# Patient Record
Sex: Female | Born: 1975 | Race: Black or African American | Hispanic: No | Smoking: Former smoker
Health system: Southern US, Community
[De-identification: ages and names within clinical notes are randomized; demographics above are authoritative.]

## PROBLEM LIST (undated history)

## (undated) DIAGNOSIS — Z9089 Acquired absence of other organs: Secondary | ICD-10-CM

## (undated) DIAGNOSIS — E119 Type 2 diabetes mellitus without complications: Secondary | ICD-10-CM

## (undated) DIAGNOSIS — O139 Gestational [pregnancy-induced] hypertension without significant proteinuria, unspecified trimester: Secondary | ICD-10-CM

## (undated) DIAGNOSIS — C679 Malignant neoplasm of bladder, unspecified: Secondary | ICD-10-CM

## (undated) DIAGNOSIS — E785 Hyperlipidemia, unspecified: Secondary | ICD-10-CM

## (undated) DIAGNOSIS — B999 Unspecified infectious disease: Secondary | ICD-10-CM

## (undated) DIAGNOSIS — B009 Herpesviral infection, unspecified: Secondary | ICD-10-CM

## (undated) DIAGNOSIS — R87629 Unspecified abnormal cytological findings in specimens from vagina: Secondary | ICD-10-CM

## (undated) HISTORY — DX: Hyperlipidemia, unspecified: E78.5

## (undated) HISTORY — DX: Acquired absence of other organs: Z90.89

## (undated) HISTORY — DX: Type 2 diabetes mellitus without complications: E11.9

## (undated) HISTORY — DX: Herpesviral infection, unspecified: B00.9

---
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
## 2014-03-08 ENCOUNTER — Encounter (HOSPITAL_COMMUNITY): Payer: Self-pay | Admitting: Emergency Medicine

## 2014-03-08 ENCOUNTER — Emergency Department (HOSPITAL_COMMUNITY)
Admission: EM | Admit: 2014-03-08 | Discharge: 2014-03-08 | Disposition: A | Discharge status: Home / Self Care | Payer: 59 | Attending: Emergency Medicine | Admitting: Emergency Medicine

## 2014-03-08 DIAGNOSIS — Z79899 Other long term (current) drug therapy: Secondary | ICD-10-CM | POA: Insufficient documentation

## 2014-03-08 DIAGNOSIS — A499 Bacterial infection, unspecified: Secondary | ICD-10-CM | POA: Insufficient documentation

## 2014-03-08 DIAGNOSIS — Z3202 Encounter for pregnancy test, result negative: Secondary | ICD-10-CM | POA: Insufficient documentation

## 2014-03-08 DIAGNOSIS — E669 Obesity, unspecified: Secondary | ICD-10-CM | POA: Insufficient documentation

## 2014-03-08 DIAGNOSIS — R319 Hematuria, unspecified: Secondary | ICD-10-CM

## 2014-03-08 DIAGNOSIS — B9689 Other specified bacterial agents as the cause of diseases classified elsewhere: Secondary | ICD-10-CM | POA: Insufficient documentation

## 2014-03-08 DIAGNOSIS — N760 Acute vaginitis: Secondary | ICD-10-CM | POA: Insufficient documentation

## 2014-03-08 LAB — POC URINE PREG, ED: Preg Test, Ur: NEGATIVE

## 2014-03-08 LAB — URINE MICROSCOPIC-ADD ON

## 2014-03-08 LAB — URINALYSIS, ROUTINE W REFLEX MICROSCOPIC
Bilirubin Urine: NEGATIVE
GLUCOSE, UA: NEGATIVE mg/dL
Ketones, ur: NEGATIVE mg/dL
LEUKOCYTES UA: NEGATIVE
Nitrite: NEGATIVE
PROTEIN: NEGATIVE mg/dL
Specific Gravity, Urine: 1.016 (ref 1.005–1.030)
UROBILINOGEN UA: 0.2 mg/dL (ref 0.0–1.0)
pH: 5.5 (ref 5.0–8.0)

## 2014-03-08 LAB — WET PREP, GENITAL
Trich, Wet Prep: NONE SEEN
Yeast Wet Prep HPF POC: NONE SEEN

## 2014-03-08 MED ORDER — METRONIDAZOLE 500 MG PO TABS: 500.0000 mg | ORAL_TABLET | Freq: Two times a day (BID) | ORAL | Status: DC

## 2014-03-08 NOTE — ED Provider Notes (Signed)
Medical screening examination/treatment/procedure(s) were performed by non-physician practitioner and as supervising physician I was immediately available for consultation/collaboration.   Kathalene Frames, MD 03/08/14 [phone]

## 2014-03-08 NOTE — ED Notes (Signed)
Pt states that she has been having hematuria in the morning when she wakes up x 1 month.  Denies pain.  Brown vaginal discharge.  Denies odor.

## 2014-03-08 NOTE — ED Provider Notes (Signed)
CSN: [id number]     Arrival date & time 03/08/14  1022 History   First MD Initiated Contact with Patient 03/08/14 1042     Chief Complaint  Patient presents with  . Hematuria     (Consider location/radiation/quality/duration/timing/severity/associated sxs/prior Treatment) HPI Comments: 38 y/o obese female presents to the ED complaining of hematuria x 1 month. Pt states she notices dark blood in her urine when she wakes up in the morning with associated increased frequency. Admits to brown vaginal discharge without an odor. Denies dysuria, abdominal pain, flank pain, fever or chills. LMP 1 month ago and only lasted 2 days. States she had sexual intercourse 2 days ago for the first time in 8 months.  Patient is a 38 y.o. female presenting with hematuria. The history is provided by the patient.  Hematuria    History reviewed. No pertinent past medical history. Past Surgical History  Procedure Laterality Date  . Tonsillectomy     History reviewed. No pertinent family history. History  Substance Use Topics  . Smoking status: Never Smoker   . Smokeless tobacco: Not on file  . Alcohol Use: Not on file   OB History   Grav Para Term Preterm Abortions TAB SAB Ect Mult Living                 Review of Systems  Genitourinary: Positive for hematuria and vaginal discharge.  All other systems reviewed and are negative.      Allergies  Review of patient's allergies indicates no known allergies.  Home Medications   Current Outpatient Rx  Name  Route  Sig  Dispense  Refill  . acetaminophen (TYLENOL) 160 MG/5ML suspension   Oral   Take by mouth every 6 (six) hours as needed for headache.         . cabergoline (DOSTINEX) 0.5 MG tablet   Oral   Take 0.25 mg by mouth 2 (two) times a week.         . metroNIDAZOLE (FLAGYL) 500 MG tablet   Oral   Take 1 tablet (500 mg total) by mouth 2 (two) times daily. One po bid x 7 days   14 tablet   0    BP 112/58  Pulse 60  Temp(Src)  98.6 F (37 C) (Oral)  Resp 16  SpO2 98%  LMP 01/08/2014 Physical Exam  Nursing note and vitals reviewed. Constitutional: She is oriented to person, place, and time. She appears well-developed and well-nourished. No distress.  HENT:  Head: Normocephalic and atraumatic.  Mouth/Throat: Oropharynx is clear and moist.  Eyes: Conjunctivae are normal.  Neck: Normal range of motion. Neck supple.  Cardiovascular: Normal rate, regular rhythm and normal heart sounds.   Pulmonary/Chest: Effort normal and breath sounds normal.  Abdominal: Soft. Bowel sounds are normal. She exhibits no distension. There is no tenderness.  Genitourinary: Uterus normal. Cervix exhibits no motion tenderness, no discharge and no friability. Right adnexum displays no mass, no tenderness and no fullness. Left adnexum displays no mass, no tenderness and no fullness. No erythema around the vagina. Vaginal discharge (clear brown) found.  Exam limited by pt's body habitus.  Musculoskeletal: Normal range of motion. She exhibits no edema.  Neurological: She is alert and oriented to person, place, and time.  Skin: Skin is warm and dry. She is not diaphoretic.  Psychiatric: She has a normal mood and affect. Her behavior is normal.    ED Course  Procedures (including critical care time) Labs Review Labs Reviewed  WET PREP, GENITAL - Abnormal; Notable for the following:    Clue Cells Wet Prep HPF POC FEW (*)    WBC, Wet Prep HPF POC FEW (*)    All other components within normal limits  URINALYSIS, ROUTINE W REFLEX MICROSCOPIC - Abnormal; Notable for the following:    Hgb urine dipstick TRACE (*)    All other components within normal limits  GC/CHLAMYDIA PROBE AMP  URINE MICROSCOPIC-ADD ON  POC URINE PREG, ED   Imaging Review No results found.   EKG Interpretation None      MDM   Final diagnoses:  BV (bacterial vaginosis)  Hematuria    Tx with flagl. Advised PCP f/u if hematuria continues. No associated pain.  No CMT or adnexal tenderness. Stable for d/c. States she has appt with PCP 4/22. Return precautions given. Patient states understanding of treatment care plan and is agreeable.     Illene Labrador, PA-C 03/08/14 1215

## 2014-03-08 NOTE — Discharge Instructions (Signed)
Take antibiotic flagyl twice daily for the next week. Do not have intercourse for 7 days. Follow up with your doctor.  Bacterial Vaginosis Bacterial vaginosis is a vaginal infection that occurs when the normal balance of bacteria in the vagina is disrupted. It results from an overgrowth of certain bacteria. This is the most common vaginal infection in women of childbearing age. Treatment is important to prevent complications, especially in pregnant women, as it can cause a premature delivery. CAUSES  Bacterial vaginosis is caused by an increase in harmful bacteria that are normally present in smaller amounts in the vagina. Several different kinds of bacteria can cause bacterial vaginosis. However, the reason that the condition develops is not fully understood. RISK FACTORS Certain activities or behaviors can put you at an increased risk of developing bacterial vaginosis, including:  Having a new sex partner or multiple sex partners.  Douching.  Using an intrauterine device (IUD) for contraception. Women do not get bacterial vaginosis from toilet seats, bedding, swimming pools, or contact with objects around them. SIGNS AND SYMPTOMS  Some women with bacterial vaginosis have no signs or symptoms. Common symptoms include:  Grey vaginal discharge.  A fishlike odor with discharge, especially after sexual intercourse.  Itching or burning of the vagina and vulva.  Burning or pain with urination. DIAGNOSIS  Your health care provider will take a medical history and examine the vagina for signs of bacterial vaginosis. A sample of vaginal fluid may be taken. Your health care provider will look at this sample under a microscope to check for bacteria and abnormal cells. A vaginal pH test may also be done.  TREATMENT  Bacterial vaginosis may be treated with antibiotic medicines. These may be given in the form of a pill or a vaginal cream. A second round of antibiotics may be prescribed if the condition  comes back after treatment.  HOME CARE INSTRUCTIONS   Only take over-the-counter or prescription medicines as directed by your health care provider.  If antibiotic medicine was prescribed, take it as directed. Make sure you finish it even if you start to feel better.  Do not have sex until treatment is completed.  Tell all sexual partners that you have a vaginal infection. They should see their health care provider and be treated if they have problems, such as a mild rash or itching.  Practice safe sex by using condoms and only having one sex partner. SEEK MEDICAL CARE IF:   Your symptoms are not improving after 3 days of treatment.  You have increased discharge or pain.  You have a fever. MAKE SURE YOU:   Understand these instructions.  Will watch your condition.  Will get help right away if you are not doing well or get worse. FOR MORE INFORMATION  Centers for Disease Control and Prevention, Division of STD Prevention: [employer] American Sexual Health Association (ASHA): www.ashastd.org  Document Released: 11/24/2005 Document Revised: 09/14/2013 Document Reviewed: 07/06/2013 [facility] Patient Information 2014 [place].  Hematuria, Adult Hematuria is blood in your urine. It can be caused by a bladder infection, kidney infection, prostate infection, kidney stone, or cancer of your urinary tract. Infections can usually be treated with medicine, and a kidney stone usually will pass through your urine. If neither of these is the cause of your hematuria, further workup to find out the reason may be needed. It is very important that you tell your health care provider about any blood you see in your urine, even if the blood stops without treatment  or happens without causing pain. Blood in your urine that happens and then stops and then happens again can be a symptom of a very serious condition. Also, pain is not a symptom in the initial stages of many urinary cancers. HOME  CARE INSTRUCTIONS   Drink lots of fluid, 3 4 quarts a day. If you have been diagnosed with an infection, cranberry juice is especially recommended, in addition to large amounts of water.  Avoid caffeine, tea, and carbonated beverages, because they tend to irritate the bladder.  Avoid alcohol because it may irritate the prostate.  Only take over-the-counter or prescription medicines for pain, discomfort, or fever as directed by your health care provider.  If you have been diagnosed with a kidney stone, follow your health care provider's instructions regarding straining your urine to catch the stone.  Empty your bladder often. Avoid holding urine for long periods of time.  After a bowel movement, women should cleanse front to back. Use each tissue only once.  Empty your bladder before and after sexual intercourse if you are a female. SEEK MEDICAL CARE IF: You develop back pain, fever, a feeling of sickness in your stomach (nausea), or vomiting or if your symptoms are not better in 3 days. Return sooner if you are getting worse. SEEK IMMEDIATE MEDICAL CARE IF:   You have a persistent fever, with a temperature of 101.62F (38.8C) or greater.  You develop severe vomiting and are unable to keep the medicine down.  You develop severe back or abdominal pain despite taking your medicines.  You begin passing a large amount of blood or clots in your urine.  You feel extremely weak or faint, or you pass out. MAKE SURE YOU:   Understand these instructions.  Will watch your condition.  Will get help right away if you are not doing well or get worse. Document Released: 11/24/2005 Document Revised: 09/14/2013 Document Reviewed: 07/25/2013 [facility] Patient Information 2014 [place].

## 2014-03-09 LAB — GC/CHLAMYDIA PROBE AMP
CT Probe RNA: NEGATIVE
GC Probe RNA: NEGATIVE

## 2014-03-29 ENCOUNTER — Encounter: Payer: Self-pay | Admitting: Family Medicine

## 2014-03-29 ENCOUNTER — Ambulatory Visit (INDEPENDENT_AMBULATORY_CARE_PROVIDER_SITE_OTHER): Payer: 59 | Admitting: Family Medicine

## 2014-03-29 VITALS — BP 112/70 | HR 53 | Temp 98.5°F | Resp 16 | Ht 64.5 in | Wt 221.8 lb

## 2014-03-29 DIAGNOSIS — D352 Benign neoplasm of pituitary gland: Secondary | ICD-10-CM

## 2014-03-29 DIAGNOSIS — L989 Disorder of the skin and subcutaneous tissue, unspecified: Secondary | ICD-10-CM

## 2014-03-29 DIAGNOSIS — E669 Obesity, unspecified: Secondary | ICD-10-CM | POA: Insufficient documentation

## 2014-03-29 DIAGNOSIS — D353 Benign neoplasm of craniopharyngeal duct: Secondary | ICD-10-CM

## 2014-03-29 DIAGNOSIS — L819 Disorder of pigmentation, unspecified: Secondary | ICD-10-CM

## 2014-03-29 MED ORDER — CABERGOLINE 0.5 MG PO TABS
0.2500 mg | ORAL_TABLET | ORAL | Status: DC
Start: 2014-03-29 — End: 2015-06-21

## 2014-03-29 NOTE — Patient Instructions (Signed)
You may want to try a skin care line "BLACK OPAL" or "AMBI" which make products for woman of color. Using these products daily will help even out your skin tone and correct any discoloration. The Dermatologist may then prescribe some other product to help correct the problem further.

## 2014-03-29 NOTE — Progress Notes (Signed)
S:  This 38 y.o. AA female is new to [facility]; she has hx of Pituitary tumor diagnosed ~ 8 years ago. An Endocrinologist in [place], [place] began treatment w/ [facility] which pt states is very helpful and menses are regular. She was under care of Ruffin Frederick, MD but his office has closed. She needs referral to Endocrine Specialist for continuation of management of this condition.  Pt has skin condition related to endocrine disturbance; she has excessive hair, especially on her face. She has been shaving facial hair and now has discoloration around jaw line x 2 years. Skincare regimen includes Noxema and some other product purchased at local pharmacy. She has tried many different types of fade creams.  Pt reports CPE/GYN exam in January or February 2015.   Patient Active Problem List   Diagnosis Date Noted  . 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 Obesity, unspecified 03/29/2014    Prior to Admission medications   Medication Sig Start Date End Date Taking? Authorizing Provider  acetaminophen (TYLENOL) 160 MG/5ML suspension Take by mouth every 6 (six) hours as needed for headache.   Yes Historical Provider, MD  cabergoline (DOSTINEX) 0.5 MG tablet Take 0.5 tablets (0.25 mg total) by mouth 2 (two) times a week.   Yes    PMHx, Surg Hx, Soc and Fam Hx reviewed.  ROS: As per HPI; otherwise noncontributory.  O: Filed Vitals:   03/29/14 1206  BP: 112/70  Pulse: 53  Temp: 98.5 F (36.9 C)  Resp: 16   GEN: In NAD; WN,WD. HENT: [place]/AT; EOMI w/ clear conj/sclerae. EACs/nose/oroph unremarkable. SKIN: Facial area- jaw line- papular hyperpigmentation; no vesicles or pustules. Similar lesions on upper chest. COR: RRR; no edema. LUNGS: Unlabored resp. MS: MAEs; no deformities, c/c/e. NEURO: A&O x 3; CNs intact. Nonfocal.  A/P: Prolactinoma - Continue current medication pending follow-up w/ specialist. Plan: Ambulatory referral to Endocrinology  Facial skin lesion - Advised new skin care regimen.  Plan:  Ambulatory referral to Dermatology  Pigmented skin lesions - Plan: Ambulatory referral to Dermatology   Meds ordered this encounter  Medications  . cabergoline (DOSTINEX) 0.5 MG tablet    Sig: Take 0.5 tablets (0.25 mg total) by mouth 2 (two) times a week.    Dispense:  10 tablet    Refill:  3

## 2014-03-31 ENCOUNTER — Telehealth: Payer: Self-pay | Admitting: *Deleted

## 2014-03-31 NOTE — Telephone Encounter (Signed)
Verdis Frederickson from [facility] D[facility] calling asking for prior authorization number on this pt. Appt 4/27 at 2pm. Transferred the call to referrals.

## 2014-04-13 ENCOUNTER — Ambulatory Visit: Payer: 59 | Admitting: Internal Medicine

## 2014-05-04 ENCOUNTER — Ambulatory Visit: Payer: 59 | Admitting: Internal Medicine

## 2014-05-04 DIAGNOSIS — Z0289 Encounter for other administrative examinations: Secondary | ICD-10-CM

## 2015-01-01 ENCOUNTER — Encounter (HOSPITAL_COMMUNITY): Payer: Self-pay | Admitting: Emergency Medicine

## 2015-01-01 ENCOUNTER — Emergency Department (HOSPITAL_COMMUNITY)
Admission: EM | Admit: 2015-01-01 | Discharge: 2015-01-02 | Disposition: A | Discharge status: Home / Self Care | Payer: BLUE CROSS/BLUE SHIELD | Attending: Emergency Medicine | Admitting: Emergency Medicine

## 2015-01-01 DIAGNOSIS — Z3202 Encounter for pregnancy test, result negative: Secondary | ICD-10-CM | POA: Diagnosis not present

## 2015-01-01 DIAGNOSIS — D72829 Elevated white blood cell count, unspecified: Secondary | ICD-10-CM | POA: Insufficient documentation

## 2015-01-01 DIAGNOSIS — R1013 Epigastric pain: Secondary | ICD-10-CM

## 2015-01-01 DIAGNOSIS — R112 Nausea with vomiting, unspecified: Secondary | ICD-10-CM | POA: Diagnosis present

## 2015-01-01 DIAGNOSIS — K219 Gastro-esophageal reflux disease without esophagitis: Secondary | ICD-10-CM | POA: Diagnosis not present

## 2015-01-01 DIAGNOSIS — R197 Diarrhea, unspecified: Secondary | ICD-10-CM

## 2015-01-01 DIAGNOSIS — K529 Noninfective gastroenteritis and colitis, unspecified: Secondary | ICD-10-CM | POA: Diagnosis not present

## 2015-01-01 MED ORDER — SODIUM CHLORIDE 0.9 % IV BOLUS (SEPSIS)
1000.0000 mL | Freq: Once | INTRAVENOUS | Status: AC
  Administered 2015-01-01: 1000 mL via INTRAVENOUS

## 2015-01-01 MED ORDER — ONDANSETRON HCL 4 MG/2ML IJ SOLN
4.0000 mg | Freq: Once | INTRAMUSCULAR | Status: AC
  Administered 2015-01-01: 4 mg via INTRAVENOUS
  Filled 2015-01-01: qty 2

## 2015-01-01 MED ORDER — GI COCKTAIL ~~LOC~~
30.0000 mL | Freq: Once | ORAL | Status: AC
  Administered 2015-01-01: 30 mL via ORAL
  Filled 2015-01-01: qty 30

## 2015-01-01 MED ORDER — PANTOPRAZOLE SODIUM 40 MG IV SOLR
40.0000 mg | Freq: Once | INTRAVENOUS | Status: AC
  Administered 2015-01-01: 40 mg via INTRAVENOUS
  Filled 2015-01-01: qty 40

## 2015-01-01 NOTE — ED Provider Notes (Signed)
CSN: [id number]     Arrival date & time 01/01/15  2230 History   First MD Initiated Contact with Patient 01/01/15 2250     Chief Complaint  Patient presents with  . Abdominal Pain  . Nausea  . Diarrhea     (Consider location/radiation/quality/duration/timing/severity/associated sxs/prior Treatment) HPI Comments: Paula Bates is a 39 y.o. female with a PMHx of prolactinoma (on cabergoline), who presents to the ED with complaints of right upper quadrant/epigastric abdominal pain that began at 3 AM a gradual onset, which is currently resolved after having taken 3 Tylenol tablets. She describes the pain as 10/10 at onset but currently resolved, crampy, intermittent, nonradiating from the epigastrum/RUQ, with no known aggravating factors, and relieved entirely with tylenol. Associated symptoms include belching, more than 10 episodes of nonbloody nonbilious emesis consisting of stomach contents, and greater than 10 episodes of watery nonbloody diarrhea. Denies fevers, chills, sore throat, CP, SOB, constipation, obstipation, hematemesis, melena, hematochezia, rectal pain, hematuria, dysuria, vaginal bleeding/discharge, myalgias, arthralgias, back/flank pain, weakness, paresthesias, lightheadedness, numbness, syncope, or rashes. LMP "first week in December" but she states she doesn't keep up with it and isn't really sure when it was. Ate 2 slices of pizza today which caused her to vomit. Last night she ate a variety of foods, had friends over for the game. Unknown sick contacts. No suspicious foods or travel, no EtOH/NSAID use, no prior abd surgeries or abdominal conditions. No recent antibiotics  Patient is a 39 y.o. female presenting with abdominal pain and diarrhea. The history is provided by the patient. No language interpreter was used.  Abdominal Pain Pain location:  RUQ and epigastric Pain quality: cramping   Pain radiates to:  Does not radiate Pain severity:  Severe (10/10 at onset, now  resolved) Onset quality:  Gradual Duration:  20 hours Timing:  Constant Progression:  Resolved Chronicity:  New Context: not alcohol use, not recent travel, not sick contacts and not suspicious food intake   Relieved by:  Acetaminophen Worsened by:  Nothing tried Ineffective treatments:  None tried Associated symptoms: belching, diarrhea, nausea and vomiting   Associated symptoms: no anorexia, no chest pain, no chills, no constipation, no cough, no dysuria, no fever, no flatus, no hematemesis, no hematochezia, no hematuria, no melena, no shortness of breath, no sore throat, no vaginal bleeding and no vaginal discharge   Risk factors: no NSAID use   Diarrhea Associated symptoms: abdominal pain and vomiting   Associated symptoms: no arthralgias, no chills, no fever and no myalgias     History reviewed. No pertinent past medical history. Past Surgical History  Procedure Laterality Date  . Tonsillectomy  1996   Family History  Problem Relation Age of Onset  . Diabetes Mother    History  Substance Use Topics  . Smoking status: Never Smoker   . Smokeless tobacco: Not on file  . Alcohol Use: Not on file   OB History    No data available     Review of Systems  Constitutional: Negative for fever and chills.  HENT: Negative for sore throat.   Respiratory: Negative for cough and shortness of breath.   Cardiovascular: Negative for chest pain.  Gastrointestinal: Positive for nausea, vomiting, abdominal pain and diarrhea. Negative for constipation, blood in stool, melena, hematochezia, abdominal distention, rectal pain, anorexia, flatus and hematemesis.  Genitourinary: Negative for dysuria, hematuria, flank pain, vaginal bleeding, vaginal discharge and menstrual problem.  Musculoskeletal: Negative for myalgias, back pain and arthralgias.  Skin: Negative for rash.  Allergic/Immunologic:  Negative for immunocompromised state.  Neurological: Negative for dizziness, syncope, weakness,  light-headedness and numbness.  Psychiatric/Behavioral: Negative for confusion.   10 Systems reviewed and are negative for acute change except as noted in the HPI.    Allergies  Review of patient's allergies indicates no known allergies.  Home Medications   Prior to Admission medications   Medication Sig Start Date End Date Taking? Authorizing Provider  diphenhydramine-acetaminophen (TYLENOL PM) 25-500 MG TABS Take 2 tablets by mouth at bedtime as needed (pain).   Yes Historical Provider, MD  cabergoline (DOSTINEX) 0.5 MG tablet Take 0.5 tablets (0.25 mg total) by mouth 2 (two) times a week. 03/29/14   Barton Fanny, MD   BP 103/43 mmHg  Pulse 61  Temp(Src) 98.1 F (36.7 C) (Oral)  Resp 16  SpO2 97%  LMP 11/20/2014 Physical Exam  Constitutional: She is oriented to person, place, and time. Vital signs are normal. She appears well-developed and well-nourished.  Non-toxic appearance. No distress.  Afebrile, nontoxic, NAD, VSS  HENT:  Head: Normocephalic and atraumatic.  Mouth/Throat: Oropharynx is clear and moist. Mucous membranes are dry.  Dry mucous membranes  Eyes: Conjunctivae and EOM are normal. Right eye exhibits no discharge. Left eye exhibits no discharge.  Neck: Normal range of motion. Neck supple.  Cardiovascular: Normal rate, regular rhythm, normal heart sounds and intact distal pulses.  Exam reveals no gallop and no friction rub.   No murmur heard. Pulmonary/Chest: Effort normal and breath sounds normal. No respiratory distress. She has no decreased breath sounds. She has no wheezes. She has no rhonchi. She has no rales.  Abdominal: Soft. Normal appearance and bowel sounds are normal. She exhibits no distension. There is tenderness in the right upper quadrant and epigastric area. There is no rigidity, no rebound, no guarding, no CVA tenderness, no tenderness at McBurney's point and negative Murphy's sign.    Soft, obese but ND, +BS throughout, with TTP in  epigastrum and RUQ region, no r/g/r, neg murphy's, neg mcburney's, no CVA TTP   Musculoskeletal: Normal range of motion.  Neurological: She is alert and oriented to person, place, and time. She has normal strength. No sensory deficit. Gait normal.  Skin: Skin is warm, dry and intact. No rash noted.  Psychiatric: She has a normal mood and affect.  Nursing note and vitals reviewed.   ED Course  Procedures (including critical care time) Labs Review Labs Reviewed  CBC WITH DIFFERENTIAL/PLATELET - Abnormal; Notable for the following:    WBC 11.1 (*)    RBC 5.43 (*)    Neutrophils Relative % 88 (*)    Neutro Abs 9.8 (*)    Lymphocytes Relative 9 (*)    All other components within normal limits  COMPREHENSIVE METABOLIC PANEL - Abnormal; Notable for the following:    Glucose, Bld 140 (*)    All other components within normal limits  LIPASE, BLOOD  URINALYSIS, ROUTINE W REFLEX MICROSCOPIC  POC URINE PREG, ED    Imaging Review No results found.   EKG Interpretation None      MDM   Final diagnoses:  Epigastric pain  Nausea vomiting and diarrhea  Gastroenteritis  Gastroesophageal reflux disease, esophagitis presence not specified    39 y.o. female with RUQ abd pain which is currently resolved, and n/v/d. Abd exam nonperitoneal without murphy's sign, pain is mostly epigastric and RUQ. Will obtain labs and give fluids and antiemetics, protonix, and GI cocktail. Pain currently resolved therefore will hold on pain meds. Likely viral  gastroenteritis although differential includes biliary colic, cholecystitis, pyelonephritis, etc. No CVA tenderness, no pelvic pain or vaginal complaints, doubt need for pelvic exam. Doubt need for imaging but will reassess after labs.  1:38 AM CBC w/diff showing mild leukocytosis which could be from viral gastroenteritis or could be hemoconcentration (no prior labs to compare to, but pt clinically dehydrated). CMP WNL. Lipase WNL. Pt has not had BM here,  and her urine specimen was contaminated therefore she needed to collect another one and hasn't yet. Pain returning somewhat, will give small amount of morphine, pt resting comfortably when assessed therefore difficult to determine how severe pain is objectively. VSS at this time, with BP improved after IVFs. Nausea resolved, and pt has been tolerating PO well. Will await urine specimen.   2:06 AM Pt urinated again but contaminated the specimen once more. Pushing fluids orally, and in and out cath will be performed in 15-20mins. Nursing stating that pt was resting when she was assessed and therefore morphine was held.  2:24 AM Pt refusing I&O cath. Will attempt to collect another urine sample. States she took home preg test recently which was neg, but discussed with her that we need to verify this. If she cannot get an uncontaminated specimen and refuses I&O cath, she would be discharged AMA. She will attempt to get urine sample now.  3:17 AM Upreg neg. U/A still pending. Tolerating PO well and pain under control. Will sign over to [employer] PA-C to check on U/A, as long as it's negative pt will be discharged home with prilosec, zofran, naprosyn, and norco. Will have her see PCP in 1wk. I explained the diagnosis and have given explicit precautions to return to the ER including for any other new or worsening symptoms. The patient understands and accepts the medical plan as it's been dictated and I have answered their questions. Discharge instructions concerning home care and prescriptions have been given. The patient is STABLE and is discharged to home in good condition unless U/A is abnormal in which case please refer to Antonietta Breach' dictation.   BP 125/57 mmHg  Pulse 53  Temp(Src) 98.1 F (36.7 C) (Oral)  Resp 18  SpO2 99%  LMP 11/20/2014  Meds ordered this encounter  Medications  . sodium chloride 0.9 % bolus 1,000 mL    Sig:   . ondansetron (ZOFRAN) injection 4 mg    Sig:   . gi cocktail  (Maalox,Lidocaine,Donnatal)    Sig:   . pantoprazole (PROTONIX) injection 40 mg    Sig:   . morphine 4 MG/ML injection 4 mg    Sig:   . ondansetron (ZOFRAN ODT) 8 MG disintegrating tablet    Sig: Take 1 tablet (8 mg total) by mouth every 8 (eight) hours as needed for nausea or vomiting.    Dispense:  10 tablet    Refill:  0  . omeprazole (PRILOSEC) 20 MG capsule    Sig: Take 1 capsule (20 mg total) by mouth daily.    Dispense:  30 capsule    Refill:  0  . naproxen (NAPROSYN) 500 MG tablet    Sig: Take 1 tablet (500 mg total) by mouth 2 (two) times daily as needed for mild pain, moderate pain or headache (TAKE WITH MEALS.).    Dispense:  20 tablet    Refill:  0  . HYDROcodone-acetaminophen (NORCO) 5-325 MG per tablet    Sig: Take 1 tablet by mouth every 6 (six) hours as needed for severe pain.  Dispense:  6 tablet    Refill:  [address] [place]-Soms, PA-C 01/02/15 [id number]  Varney Biles, MD 01/03/15 2320

## 2015-01-01 NOTE — ED Notes (Signed)
Bed: FH54 Expected date: 01/01/15 Expected time: 10:13 PM Means of arrival: Ambulance Comments: 38 yo F  Emesis, diarrhea

## 2015-01-01 NOTE — ED Notes (Signed)
Patient presents from home via EMS for N/V/D. Patient c/o RUQ x1 day. Patient reports taking two Tylenol PM  At approximately 2100 this evening. A&O x4.

## 2015-01-01 NOTE — ED Notes (Signed)
Patient c/o RUQ abdominal pain, described as sharp in nature. Patient states "took 2 tylenol PM at 9pm and they have begun to kick in". Patient denies pain at this time. Patient reports diarrhea x1 day, "too many episodes to count". Patient ambulatory in hallway without assistance, denies dizziness and lightheadedness.

## 2015-01-02 LAB — COMPREHENSIVE METABOLIC PANEL
ALT: 18 U/L (ref 0–35)
ANION GAP: 9 (ref 5–15)
AST: 16 U/L (ref 0–37)
Albumin: 4.5 g/dL (ref 3.5–5.2)
Alkaline Phosphatase: 99 U/L (ref 39–117)
BILIRUBIN TOTAL: 0.5 mg/dL (ref 0.3–1.2)
BUN: 13 mg/dL (ref 6–23)
CALCIUM: 9.4 mg/dL (ref 8.4–10.5)
CO2: 24 mmol/L (ref 19–32)
Chloride: 106 mmol/L (ref 96–112)
Creatinine, Ser: 0.78 mg/dL (ref 0.50–1.10)
Glucose, Bld: 140 mg/dL — ABNORMAL HIGH (ref 70–99)
Potassium: 4.2 mmol/L (ref 3.5–5.1)
SODIUM: 139 mmol/L (ref 135–145)
Total Protein: 8.2 g/dL (ref 6.0–8.3)

## 2015-01-02 LAB — POC URINE PREG, ED: Preg Test, Ur: NEGATIVE

## 2015-01-02 LAB — CBC WITH DIFFERENTIAL/PLATELET
Basophils Absolute: 0 10*3/uL (ref 0.0–0.1)
Basophils Relative: 0 % (ref 0–1)
EOS ABS: 0 10*3/uL (ref 0.0–0.7)
Eosinophils Relative: 0 % (ref 0–5)
HCT: 42.5 % (ref 36.0–46.0)
Hemoglobin: 14.4 g/dL (ref 12.0–15.0)
LYMPHS PCT: 9 % — AB (ref 12–46)
Lymphs Abs: 1 10*3/uL (ref 0.7–4.0)
MCH: 26.5 pg (ref 26.0–34.0)
MCHC: 33.9 g/dL (ref 30.0–36.0)
MCV: 78.3 fL (ref 78.0–100.0)
Monocytes Absolute: 0.3 10*3/uL (ref 0.1–1.0)
Monocytes Relative: 3 % (ref 3–12)
Neutro Abs: 9.8 10*3/uL — ABNORMAL HIGH (ref 1.7–7.7)
Neutrophils Relative %: 88 % — ABNORMAL HIGH (ref 43–77)
Platelets: 266 10*3/uL (ref 150–400)
RBC: 5.43 MIL/uL — AB (ref 3.87–5.11)
RDW: 13.8 % (ref 11.5–15.5)
WBC: 11.1 10*3/uL — AB (ref 4.0–10.5)

## 2015-01-02 LAB — URINALYSIS, ROUTINE W REFLEX MICROSCOPIC
BILIRUBIN URINE: NEGATIVE
GLUCOSE, UA: NEGATIVE mg/dL
KETONES UR: NEGATIVE mg/dL
Leukocytes, UA: NEGATIVE
NITRITE: NEGATIVE
Protein, ur: NEGATIVE mg/dL
Specific Gravity, Urine: 1.02 (ref 1.005–1.030)
UROBILINOGEN UA: 0.2 mg/dL (ref 0.0–1.0)
pH: 5 (ref 5.0–8.0)

## 2015-01-02 LAB — URINE MICROSCOPIC-ADD ON

## 2015-01-02 LAB — LIPASE, BLOOD: Lipase: 34 U/L (ref 11–59)

## 2015-01-02 MED ORDER — MORPHINE SULFATE 4 MG/ML IJ SOLN
4.0000 mg | Freq: Once | INTRAMUSCULAR | Status: AC
  Administered 2015-01-02: 4 mg via INTRAVENOUS
  Filled 2015-01-02: qty 1

## 2015-01-02 MED ORDER — ONDANSETRON 8 MG PO TBDP: 8.0000 mg | ORAL_TABLET | Freq: Three times a day (TID) | ORAL | Status: DC | PRN

## 2015-01-02 MED ORDER — OMEPRAZOLE 20 MG PO CPDR: 20.0000 mg | DELAYED_RELEASE_CAPSULE | Freq: Every day | ORAL | Status: DC

## 2015-01-02 MED ORDER — NAPROXEN 500 MG PO TABS: 500.0000 mg | ORAL_TABLET | Freq: Two times a day (BID) | ORAL | Status: DC | PRN

## 2015-01-02 MED ORDER — HYDROCODONE-ACETAMINOPHEN 5-325 MG PO TABS: 1.0000 | ORAL_TABLET | Freq: Four times a day (QID) | ORAL | Status: DC | PRN

## 2015-01-02 NOTE — ED Provider Notes (Signed)
0415 - Patient care assumed from [facility], PA-C at shift change. Plan discussed with Camprubi-Soms, PA-C which includes discharge if urinalysis unremarkable. Urinalysis reviewed which is significant only for microscopic hematuria with RBCs of 11-20. Doubt that this is responsible for patient's symptoms of N/V/D. No evidence of acute blood loss. No AKI. Patient hemodynamically stable and appropriate for discharge. Urine has been sent for culture. Return precautions provided. Patient discharged in good condition.  Filed Vitals:   01/01/15 2230 01/02/15 0149 01/02/15 0410  BP: 103/43 125/57 111/47  Pulse: 61 53 64  Temp: 98.1 F (36.7 C)    TempSrc: Oral    Resp: 16 18 16   SpO2: 97% 99% 98%   Results for orders placed or performed during the hospital encounter of 01/01/15  CBC with Differential  Result Value Ref Range   WBC 11.1 (H) 4.0 - 10.5 K/uL   RBC 5.43 (H) 3.87 - 5.11 MIL/uL   Hemoglobin 14.4 12.0 - 15.0 g/dL   HCT 42.5 36.0 - 46.0 %   MCV 78.3 78.0 - 100.0 fL   MCH 26.5 26.0 - 34.0 pg   MCHC 33.9 30.0 - 36.0 g/dL   RDW 13.8 11.5 - 15.5 %   Platelets 266 150 - 400 K/uL   Neutrophils Relative % 88 (H) 43 - 77 %   Neutro Abs 9.8 (H) 1.7 - 7.7 K/uL   Lymphocytes Relative 9 (L) 12 - 46 %   Lymphs Abs 1.0 0.7 - 4.0 K/uL   Monocytes Relative 3 3 - 12 %   Monocytes Absolute 0.3 0.1 - 1.0 K/uL   Eosinophils Relative 0 0 - 5 %   Eosinophils Absolute 0.0 0.0 - 0.7 K/uL   Basophils Relative 0 0 - 1 %   Basophils Absolute 0.0 0.0 - 0.1 K/uL  Comprehensive metabolic panel  Result Value Ref Range   Sodium 139 135 - 145 mmol/L   Potassium 4.2 3.5 - 5.1 mmol/L   Chloride 106 96 - 112 mmol/L   CO2 24 19 - 32 mmol/L   Glucose, Bld 140 (H) 70 - 99 mg/dL   BUN 13 6 - 23 mg/dL   Creatinine, Ser 0.78 0.50 - 1.10 mg/dL   Calcium 9.4 8.4 - 10.5 mg/dL   Total Protein 8.2 6.0 - 8.3 g/dL   Albumin 4.5 3.5 - 5.2 g/dL   AST 16 0 - 37 U/L   ALT 18 0 - 35 U/L   Alkaline Phosphatase  99 39 - 117 U/L   Total Bilirubin 0.5 0.3 - 1.2 mg/dL   GFR calc non Af Amer >90 >90 mL/min   GFR calc Af Amer >90 >90 mL/min   Anion gap 9 5 - 15  Lipase, blood  Result Value Ref Range   Lipase 34 11 - 59 U/L  Urinalysis, Routine w reflex microscopic  Result Value Ref Range   Color, Urine YELLOW YELLOW   APPearance CLOUDY (A) CLEAR   Specific Gravity, Urine 1.020 1.005 - 1.030   pH 5.0 5.0 - 8.0   Glucose, UA NEGATIVE NEGATIVE mg/dL   Hgb urine dipstick MODERATE (A) NEGATIVE   Bilirubin Urine NEGATIVE NEGATIVE   Ketones, ur NEGATIVE NEGATIVE mg/dL   Protein, ur NEGATIVE NEGATIVE mg/dL   Urobilinogen, UA 0.2 0.0 - 1.0 mg/dL   Nitrite NEGATIVE NEGATIVE   Leukocytes, UA NEGATIVE NEGATIVE  Urine microscopic-add on  Result Value Ref Range   Squamous Epithelial / LPF FEW (A) RARE   RBC / HPF 11-20 <3  RBC/hpf  POC urine preg, ED (not at [facility])  Result Value Ref Range   Preg Test, Ur NEGATIVE NEGATIVE    Antonietta Breach, PA-C 01/02/15 [id number]

## 2015-01-02 NOTE — Discharge Instructions (Signed)
Use zofran as prescribed, as needed for nausea. Stay well hydrated with small sips of fluids throughout the day. Use naprosyn and norco as directed as needed for pain but don't drive while taking norco. Use prilosec as directed until you see your regular doctor. Make an appointment with your primary doctor in 1 week for recheck. Follow a BRAT (banana-rice-applesauce-toast) diet as described below for the next 24-48 hours. The 'BRAT' diet is suggested, then progress to diet as tolerated as symptoms abate. Call if bloody stools, persistent diarrhea, vomiting, fever or abdominal pain. Return to ER for changing or worsening of symptoms.  Food Choices to Help Relieve Diarrhea When you have diarrhea, the foods you eat and your eating habits are very important. Choosing the right foods and drinks can help relieve diarrhea. Also, because diarrhea can last up to 7 days, you need to replace lost fluids and electrolytes (such as sodium, potassium, and chloride) in order to help prevent dehydration.  WHAT GENERAL GUIDELINES DO I NEED TO FOLLOW?  Slowly drink 1 cup (8 oz) of fluid for each episode of diarrhea. If you are getting enough fluid, your urine will be clear or pale yellow.  Eat starchy foods. Some good choices include white rice, white toast, pasta, low-fiber cereal, baked potatoes (without the skin), saltine crackers, and bagels.  Avoid large servings of any cooked vegetables.  Limit fruit to two servings per day. A serving is  cup or 1 small piece.  Choose foods with less than 2 g of fiber per serving.  Limit fats to less than 8 tsp (38 g) per day.  Avoid fried foods.  Eat foods that have probiotics in them. Probiotics can be found in certain dairy products.  Avoid foods and beverages that may increase the speed at which food moves through the stomach and intestines (gastrointestinal tract). Things to avoid include:  High-fiber foods, such as dried fruit, raw fruits and vegetables, nuts,  seeds, and whole grain foods.  Spicy foods and high-fat foods.  Foods and beverages sweetened with high-fructose corn syrup, honey, or sugar alcohols such as xylitol, sorbitol, and mannitol. WHAT FOODS ARE RECOMMENDED? Grains White rice. White, [place], or pita breads (fresh or toasted), including plain rolls, buns, or bagels. White pasta. Saltine, soda, or graham crackers. Pretzels. Low-fiber cereal. Cooked cereals made with water (such as cornmeal, farina, or cream cereals). Plain muffins. Matzo. Melba toast. Zwieback.  Vegetables Potatoes (without the skin). Strained tomato and vegetable juices. Most well-cooked and canned vegetables without seeds. Tender lettuce. Fruits Cooked or canned applesauce, apricots, cherries, fruit cocktail, grapefruit, peaches, pears, or plums. Fresh bananas, apples without skin, cherries, grapes, cantaloupe, grapefruit, peaches, oranges, or plums.  Meat and Other Protein Products Baked or boiled chicken. Eggs. Tofu. Fish. Seafood. Smooth peanut butter. Ground or well-cooked tender beef, ham, veal, lamb, pork, or poultry.  Dairy Plain yogurt, kefir, and unsweetened liquid yogurt. Lactose-free milk, buttermilk, or soy milk. Plain hard cheese. Beverages Sport drinks. Clear broths. Diluted fruit juices (except prune). Regular, caffeine-free sodas such as ginger ale. Water. Decaffeinated teas. Oral rehydration solutions. Sugar-free beverages not sweetened with sugar alcohols. Other Bouillon, broth, or soups made from recommended foods.  The items listed above may not be a complete list of recommended foods or beverages. Contact your dietitian for more options. WHAT FOODS ARE NOT RECOMMENDED? Grains Whole grain, whole wheat, bran, or rye breads, rolls, pastas, crackers, and cereals. Wild or brown rice. Cereals that contain more than 2 g of fiber per serving.  Corn tortillas or taco shells. Cooked or dry oatmeal. Granola. Popcorn. Vegetables Raw vegetables. Cabbage,  broccoli, Brussels sprouts, artichokes, baked beans, beet greens, corn, kale, legumes, peas, sweet potatoes, and yams. Potato skins. Cooked spinach and cabbage. Fruits Dried fruit, including raisins and dates. Raw fruits. Stewed or dried prunes. Fresh apples with skin, apricots, mangoes, pears, raspberries, and strawberries.  Meat and Other Protein Products Chunky peanut butter. Nuts and seeds. Beans and lentils. Berniece Salines.  Dairy High-fat cheeses. Milk, chocolate milk, and beverages made with milk, such as milk shakes. Cream. Ice cream. Sweets and Desserts Sweet rolls, doughnuts, and sweet breads. Pancakes and waffles. Fats and Oils Butter. Cream sauces. Margarine. Salad oils. Plain salad dressings. Olives. Avocados.  Beverages Caffeinated beverages (such as coffee, tea, soda, or energy drinks). Alcoholic beverages. Fruit juices with pulp. Prune juice. Soft drinks sweetened with high-fructose corn syrup or sugar alcohols. Other Coconut. Hot sauce. Chili powder. Mayonnaise. Gravy. Cream-based or milk-based soups.  The items listed above may not be a complete list of foods and beverages to avoid. Contact your dietitian for more information. WHAT SHOULD I DO IF I BECOME DEHYDRATED? Diarrhea can sometimes lead to dehydration. Signs of dehydration include dark urine and dry mouth and skin. If you think you are dehydrated, you should rehydrate with an oral rehydration solution. These solutions can be purchased at pharmacies, retail stores, or online.  Drink -1 cup (120-240 mL) of oral rehydration solution each time you have an episode of diarrhea. If drinking this amount makes your diarrhea worse, try drinking smaller amounts more often. For example, drink 1-3 tsp (5-15 mL) every 5-10 minutes.  A general rule for staying hydrated is to drink 1-2 L of fluid per day. Talk to your health care provider about the specific amount you should be drinking each day. Drink enough fluids to keep your urine clear or  pale yellow. Document Released: 02/14/2004 Document Revised: 11/29/2013 Document Reviewed: 10/17/2013 [facility] Patient Information 2015 [place], [place]. This information is not intended to replace advice given to you by your health care provider. Make sure you discuss any questions you have with your health care provider.   Abdominal Pain Many things can cause belly (abdominal) pain. Most times, the belly pain is not dangerous. Many cases of belly pain can be watched and treated at home. HOME CARE   Do not take medicines that help you go poop (laxatives) unless told to by your doctor.  Only take medicine as told by your doctor.  Eat or drink as told by your doctor. Your doctor will tell you if you should be on a special diet. GET HELP IF:  You do not know what is causing your belly pain.  You have belly pain while you are sick to your stomach (nauseous) or have runny poop (diarrhea).  You have pain while you pee or poop.  Your belly pain wakes you up at night.  You have belly pain that gets worse or better when you eat.  You have belly pain that gets worse when you eat fatty foods.  You have a fever. GET HELP RIGHT AWAY IF:   The pain does not go away within 2 hours.  You keep throwing up (vomiting).  The pain changes and is only in the right or left part of the belly.  You have bloody or tarry looking poop. MAKE SURE YOU:   Understand these instructions.  Will watch your condition.  Will get help right away if you are not doing  well or get worse. Document Released: 05/12/2008 Document Revised: 11/29/2013 Document Reviewed: 08/03/2013 [facility] Patient Information 2015 [place], [place]. This information is not intended to replace advice given to you by your health care provider. Make sure you discuss any questions you have with your health care provider.  Diarrhea Diarrhea is frequent loose and watery bowel movements. It can cause you to feel weak and dehydrated.  Dehydration can cause you to become tired and thirsty, have a dry mouth, and have decreased urination that often is dark yellow. Diarrhea is a sign of another problem, most often an infection that will not last long. In most cases, diarrhea typically lasts 2-3 days. However, it can last longer if it is a sign of something more serious. It is important to treat your diarrhea as directed by your caregiver to lessen or prevent future episodes of diarrhea. CAUSES  Some common causes include:  Gastrointestinal infections caused by viruses, bacteria, or parasites.  Food poisoning or food allergies.  Certain medicines, such as antibiotics, chemotherapy, and laxatives.  Artificial sweeteners and fructose.  Digestive disorders. HOME CARE INSTRUCTIONS  Ensure adequate fluid intake (hydration): Have 1 cup (8 oz) of fluid for each diarrhea episode. Avoid fluids that contain simple sugars or sports drinks, fruit juices, whole milk products, and sodas. Your urine should be clear or pale yellow if you are drinking enough fluids. Hydrate with an oral rehydration solution that you can purchase at pharmacies, retail stores, and online. You can prepare an oral rehydration solution at home by mixing the following ingredients together:   - tsp table salt.   tsp baking soda.   tsp salt substitute containing potassium chloride.  1  tablespoons sugar.  1 L (34 oz) of water.  Certain foods and beverages may increase the speed at which food moves through the gastrointestinal (GI) tract. These foods and beverages should be avoided and include:  Caffeinated and alcoholic beverages.  High-fiber foods, such as raw fruits and vegetables, nuts, seeds, and whole grain breads and cereals.  Foods and beverages sweetened with sugar alcohols, such as xylitol, sorbitol, and mannitol.  Some foods may be well tolerated and may help thicken stool including:  Starchy foods, such as rice, toast, pasta, low-sugar cereal,  oatmeal, grits, baked potatoes, crackers, and bagels.  Bananas.  Applesauce.  Add probiotic-rich foods to help increase healthy bacteria in the GI tract, such as yogurt and fermented milk products.  Wash your hands well after each diarrhea episode.  Only take over-the-counter or prescription medicines as directed by your caregiver.  Take a warm bath to relieve any burning or pain from frequent diarrhea episodes. SEEK IMMEDIATE MEDICAL CARE IF:   You are unable to keep fluids down.  You have persistent vomiting.  You have blood in your stool, or your stools are black and tarry.  You do not urinate in 6-8 hours, or there is only a small amount of very dark urine.  You have abdominal pain that increases or localizes.  You have weakness, dizziness, confusion, or light-headedness.  You have a severe headache.  Your diarrhea gets worse or does not get better.  You have a fever or persistent symptoms for more than 2-3 days.  You have a fever and your symptoms suddenly get worse. MAKE SURE YOU:   Understand these instructions.  Will watch your condition.  Will get help right away if you are not doing well or get worse. Document Released: 11/14/2002 Document Revised: 04/10/2014 Document Reviewed: 08/01/2012 [employer] Patient  Information 2015 [place], [place]. This information is not intended to replace advice given to you by your health care provider. Make sure you discuss any questions you have with your health care provider.  Nausea and Vomiting Nausea means you feel sick to your stomach. Throwing up (vomiting) is a reflex where stomach contents come out of your mouth. HOME CARE   Take medicine as told by your doctor.  Do not force yourself to eat. However, you do need to drink fluids.  If you feel like eating, eat a normal diet as told by your doctor.  Eat rice, wheat, potatoes, bread, lean meats, yogurt, fruits, and vegetables.  Avoid high-fat foods.  Drink enough  fluids to keep your pee (urine) clear or pale yellow.  Ask your doctor how to replace body fluid losses (rehydrate). Signs of body fluid loss (dehydration) include:  Feeling very thirsty.  Dry lips and mouth.  Feeling dizzy.  Dark pee.  Peeing less than normal.  Feeling confused.  Fast breathing or heart rate. GET HELP RIGHT AWAY IF:   You have blood in your throw up.  You have black or bloody poop (stool).  You have a bad headache or stiff neck.  You feel confused.  You have bad belly (abdominal) pain.  You have chest pain or trouble breathing.  You do not pee at least once every 8 hours.  You have cold, clammy skin.  You keep throwing up after 24 to 48 hours.  You have a fever. MAKE SURE YOU:   Understand these instructions.  Will watch your condition.  Will get help right away if you are not doing well or get worse. Document Released: 05/12/2008 Document Revised: 02/16/2012 Document Reviewed: 04/25/2011 [facility] Patient Information 2015 [place], [place]. This information is not intended to replace advice given to you by your health care provider. Make sure you discuss any questions you have with your health care provider.  Viral Gastroenteritis Viral gastroenteritis is also known as stomach flu. This condition affects the stomach and intestinal tract. It can cause sudden diarrhea and vomiting. The illness typically lasts 3 to 8 days. Most people develop an immune response that eventually gets rid of the virus. While this natural response develops, the virus can make you quite ill. CAUSES  Many different viruses can cause gastroenteritis, such as rotavirus or noroviruses. You can catch one of these viruses by consuming contaminated food or water. You may also catch a virus by sharing utensils or other personal items with an infected person or by touching a contaminated surface. SYMPTOMS  The most common symptoms are diarrhea and vomiting. These problems can cause  a severe loss of body fluids (dehydration) and a body salt (electrolyte) imbalance. Other symptoms may include:  Fever.  Headache.  Fatigue.  Abdominal pain. DIAGNOSIS  Your caregiver can usually diagnose viral gastroenteritis based on your symptoms and a physical exam. A stool sample may also be taken to test for the presence of viruses or other infections. TREATMENT  This illness typically goes away on its own. Treatments are aimed at rehydration. The most serious cases of viral gastroenteritis involve vomiting so severely that you are not able to keep fluids down. In these cases, fluids must be given through an intravenous line (IV). HOME CARE INSTRUCTIONS   Drink enough fluids to keep your urine clear or pale yellow. Drink small amounts of fluids frequently and increase the amounts as tolerated.  Ask your caregiver for specific rehydration instructions.  Avoid:  Foods  high in sugar.  Alcohol.  Carbonated drinks.  Tobacco.  Juice.  Caffeine drinks.  Extremely hot or cold fluids.  Fatty, greasy foods.  Too much intake of anything at one time.  Dairy products until 24 to 48 hours after diarrhea stops.  You may consume probiotics. Probiotics are active cultures of beneficial bacteria. They may lessen the amount and number of diarrheal stools in adults. Probiotics can be found in yogurt with active cultures and in supplements.  Wash your hands well to avoid spreading the virus.  Only take over-the-counter or prescription medicines for pain, discomfort, or fever as directed by your caregiver. Do not give aspirin to children. Antidiarrheal medicines are not recommended.  Ask your caregiver if you should continue to take your regular prescribed and over-the-counter medicines.  Keep all follow-up appointments as directed by your caregiver. SEEK IMMEDIATE MEDICAL CARE IF:   You are unable to keep fluids down.  You do not urinate at least once every 6 to 8 hours.  You  develop shortness of breath.  You notice blood in your stool or vomit. This may look like coffee grounds.  You have abdominal pain that increases or is concentrated in one small area (localized).  You have persistent vomiting or diarrhea.  You have a fever.  The patient is a child younger than 3 months, and he or she has a fever.  The patient is a child older than 3 months, and he or she has a fever and persistent symptoms.  The patient is a child older than 3 months, and he or she has a fever and symptoms suddenly get worse.  The patient is a baby, and he or she has no tears when crying. MAKE SURE YOU:   Understand these instructions.  Will watch your condition.  Will get help right away if you are not doing well or get worse. Document Released: 11/24/2005 Document Revised: 02/16/2012 Document Reviewed: 09/10/2011 [facility] Patient Information 2015 [place], [place]. This information is not intended to replace advice given to you by your health care provider. Make sure you discuss any questions you have with your health care provider.

## 2015-01-03 LAB — URINE CULTURE: Colony Count: >=100000

## 2015-04-23 ENCOUNTER — Encounter: Payer: Self-pay | Admitting: Family Medicine

## 2015-06-21 ENCOUNTER — Other Ambulatory Visit: Payer: Self-pay | Admitting: Family Medicine

## 2015-07-16 ENCOUNTER — Encounter: Payer: Self-pay | Admitting: Family Medicine

## 2015-09-18 ENCOUNTER — Encounter (HOSPITAL_COMMUNITY): Payer: Self-pay | Admitting: *Deleted

## 2015-09-18 ENCOUNTER — Emergency Department (HOSPITAL_COMMUNITY)
Admission: EM | Admit: 2015-09-18 | Discharge: 2015-09-18 | Disposition: A | Discharge status: Home / Self Care | Payer: BLUE CROSS/BLUE SHIELD | Attending: Emergency Medicine | Admitting: Emergency Medicine

## 2015-09-18 DIAGNOSIS — R319 Hematuria, unspecified: Secondary | ICD-10-CM | POA: Insufficient documentation

## 2015-09-18 LAB — URINE MICROSCOPIC-ADD ON

## 2015-09-18 LAB — URINALYSIS, ROUTINE W REFLEX MICROSCOPIC
Bilirubin Urine: NEGATIVE
GLUCOSE, UA: NEGATIVE mg/dL
Ketones, ur: NEGATIVE mg/dL
LEUKOCYTES UA: NEGATIVE
Nitrite: NEGATIVE
PH: 5.5 (ref 5.0–8.0)
Protein, ur: NEGATIVE mg/dL
Specific Gravity, Urine: 1.017 (ref 1.005–1.030)
Urobilinogen, UA: 0.2 mg/dL (ref 0.0–1.0)

## 2015-09-18 NOTE — Discharge Instructions (Signed)

## 2015-09-18 NOTE — ED Notes (Signed)
Pt not in room with explain d/c instructions.

## 2015-09-18 NOTE — ED Provider Notes (Signed)
CSN: [id number]     Arrival date & time 09/18/15  2033 History  By signing my name below, I, Julien Nordmann, attest that this documentation has been prepared under the direction and in the presence of Charlann Lange, PA-C. Electronically Signed: Julien Nordmann, ED Scribe. 09/18/2015. 9:58 PM.      Chief Complaint  Patient presents with  . Hematuria      The history is provided by the patient. No language interpreter was used.   HPI Comments: Paula Bates is a 39 y.o. female who presents to the Emergency Department complaining of intermittent, gradual worsening hematuria onset 2 weeks ago. She states she was seen a few months ago for the same symptoms but was told it was her menstrual cycle. Pt states this has been going on before her menstrual cycle and has stayed. Pt reports that there is no blood in her underwear but she notices it when she wipes. She notes her last menstrual cycle was 10/09. Pt denies vaginal bleeding, vaginal pain, nausea, vomiting, light headedness, fever, abdominal pain, diarrhea and kidney issues.   History reviewed. No pertinent past medical history. Past Surgical History  Procedure Laterality Date  . Tonsillectomy  1996   Family History  Problem Relation Age of Onset  . Diabetes Mother    Social History  Substance Use Topics  . Smoking status: Never Smoker   . Smokeless tobacco: None  . Alcohol Use: Yes     Comment: occ   OB History    No data available     Review of Systems  Gastrointestinal: Negative for nausea, vomiting, abdominal pain and diarrhea.  Genitourinary: Positive for hematuria. Negative for vaginal bleeding and vaginal pain.  Neurological: Negative for light-headedness.  All other systems reviewed and are negative.     Allergies  Review of patient's allergies indicates no known allergies.  Home Medications   Prior to Admission medications   Medication Sig Start Date End Date Taking? Authorizing Provider  cabergoline  (DOSTINEX) 0.5 MG tablet Take 0.5 tablets (0.25 mg total) by mouth 2 (two) times a week. PATIENT NEEDS OFFICE VISIT FOR ADDITIONAL REFILLS Patient not taking: Reported on 09/18/2015 06/22/15   Chelle Jeffery, PA-C  ondansetron (ZOFRAN ODT) 8 MG disintegrating tablet Take 1 tablet (8 mg total) by mouth every 8 (eight) hours as needed for nausea or vomiting. Patient not taking: Reported on 09/18/2015 01/02/15   Mercedes Camprubi-Soms, PA-C   Triage vitals: BP 136/62 mmHg  Pulse 67  Temp(Src) 98.2 F (36.8 C) (Oral)  Resp 20  Wt 224 lb (101.606 kg)  SpO2 100%  LMP 09/16/2015 Physical Exam  Constitutional: She is oriented to person, place, and time. She appears well-developed and well-nourished.  HENT:  Head: Normocephalic and atraumatic.  Eyes: Conjunctivae are normal. Right eye exhibits no discharge. Left eye exhibits no discharge.  Pulmonary/Chest: Effort normal. No respiratory distress.  Abdominal: There is no tenderness.  Genitourinary:  No cervical bleeding or discharge visualized. Pelvic non-tender throughout on bimanual exam.   Neurological: She is alert and oriented to person, place, and time. Coordination normal.  Skin: Skin is warm and dry. No rash noted. She is not diaphoretic. No erythema.  Psychiatric: She has a normal mood and affect.  Nursing note and vitals reviewed.   ED Course  Procedures  DIAGNOSTIC STUDIES: Oxygen Saturation is 100% on RA, normal by my interpretation.  COORDINATION OF CARE:  10:32 PM Discussed treatment plan which includes visit urinary tract doctor and pelvic exam with pt  at bedside and pt agreed to plan.  Labs Review Labs Reviewed  URINALYSIS, ROUTINE W REFLEX MICROSCOPIC (NOT AT [facility]) - Abnormal; Notable for the following:    Hgb urine dipstick MODERATE (*)    All other components within normal limits  URINE MICROSCOPIC-ADD ON  POC URINE PREG, ED   Results for orders placed or performed during the hospital encounter of 09/18/15  Urine  culture  Result Value Ref Range   Specimen Description URINE, CLEAN CATCH    Special Requests NONE    Culture      MULTIPLE SPECIES PRESENT, SUGGEST RECOLLECTION Performed at [facility]    Report Status 09/20/2015 FINAL   Urinalysis, Routine w reflex microscopic (not at [facility])  Result Value Ref Range   Color, Urine YELLOW YELLOW   APPearance CLEAR CLEAR   Specific Gravity, Urine 1.017 1.005 - 1.030   pH 5.5 5.0 - 8.0   Glucose, UA NEGATIVE NEGATIVE mg/dL   Hgb urine dipstick MODERATE (A) NEGATIVE   Bilirubin Urine NEGATIVE NEGATIVE   Ketones, ur NEGATIVE NEGATIVE mg/dL   Protein, ur NEGATIVE NEGATIVE mg/dL   Urobilinogen, UA 0.2 0.0 - 1.0 mg/dL   Nitrite NEGATIVE NEGATIVE   Leukocytes, UA NEGATIVE NEGATIVE  Urine microscopic-add on  Result Value Ref Range   Squamous Epithelial / LPF RARE RARE   WBC, UA 0-2 <3 WBC/hpf   RBC / HPF 21-50 <3 RBC/hpf    Imaging Review No results found. I have personally reviewed and evaluated these images and lab results as part of my medical decision-making.   EKG Interpretation None      MDM   Final diagnoses:  None    1. Hematuria  Discussed importance of follow up with urology for further evaluation. She is concerned about being uninsured and availability of follow up. Recommended making an appointment with PCP if urology is unavailable.  I personally performed the services described in this documentation, which was scribed in my presence. The recorded information has been reviewed and is accurate.    Charlann Lange, PA-C 09/21/15 2107  Forde Dandy, MD 09/22/15 [phone]

## 2015-09-18 NOTE — ED Notes (Signed)
Pt states that she began to have hematuria 2 weeks ago; pt states that it has been consistent and continues; pt denies pain when she urinates; pt c/o frequency and urgency with urination

## 2015-09-19 ENCOUNTER — Ambulatory Visit: Payer: Self-pay

## 2015-09-20 ENCOUNTER — Ambulatory Visit (INDEPENDENT_AMBULATORY_CARE_PROVIDER_SITE_OTHER): Payer: Self-pay | Admitting: Emergency Medicine

## 2015-09-20 VITALS — BP 120/70 | HR 91 | Temp 98.1°F | Resp 20 | Ht 63.5 in | Wt 224.0 lb

## 2015-09-20 DIAGNOSIS — R87619 Unspecified abnormal cytological findings in specimens from cervix uteri: Secondary | ICD-10-CM

## 2015-09-20 DIAGNOSIS — N3090 Cystitis, unspecified without hematuria: Secondary | ICD-10-CM

## 2015-09-20 DIAGNOSIS — R300 Dysuria: Secondary | ICD-10-CM

## 2015-09-20 LAB — POC MICROSCOPIC URINALYSIS (UMFC): Mucus: ABSENT

## 2015-09-20 LAB — POCT URINALYSIS DIP (MANUAL ENTRY)
BILIRUBIN UA: NEGATIVE
Glucose, UA: NEGATIVE
Ketones, POC UA: NEGATIVE
LEUKOCYTES UA: NEGATIVE
NITRITE UA: NEGATIVE
PH UA: 5
PROTEIN UA: NEGATIVE
Spec Grav, UA: 1.025
Urobilinogen, UA: 0.2

## 2015-09-20 LAB — URINE CULTURE

## 2015-09-20 LAB — POCT WET + KOH PREP
TRICH BY WET PREP: ABSENT
YEAST BY KOH: ABSENT
Yeast by wet prep: ABSENT

## 2015-09-20 MED ORDER — SULFAMETHOXAZOLE-TRIMETHOPRIM 800-160 MG PO TABS: 1.0000 | ORAL_TABLET | Freq: Two times a day (BID) | ORAL | Status: DC

## 2015-09-20 NOTE — Progress Notes (Signed)
Subjective:  Patient ID: Paula Bates, female    DOB: 08-24-1976  Age: 39 y.o. MRN: [id number]  CC: Hematuria   HPI [name] presents   Patient's been having urgency and hematuria for over a week. He also has some brown discharge from her vagina. The abnormal bleeding or dyspareunia. She has no abdominal pain nausea vomiting fever chills. She was seen in the emergency room last night and there is some confusion about the records records shows that she had a pelvic exam but the patient states she had a normal "Pap smear last night". She has a history of atypical Pap in the past treated with cryosurgery follow-up.  History [name] has no past medical history on file.   She has past surgical history that includes Tonsillectomy (1996).   Her  family history includes Diabetes in her mother.  She   reports that she has never smoked. She does not have any smokeless tobacco history on file. She reports that she drinks alcohol. She reports that she does not use illicit drugs.  Outpatient Prescriptions Prior to Visit  Medication Sig Dispense Refill  . cabergoline (DOSTINEX) 0.5 MG tablet Take 0.5 tablets (0.25 mg total) by mouth 2 (two) times a week. PATIENT NEEDS OFFICE VISIT FOR ADDITIONAL REFILLS (Patient not taking: Reported on 09/18/2015) 4 tablet 0  . ondansetron (ZOFRAN ODT) 8 MG disintegrating tablet Take 1 tablet (8 mg total) by mouth every 8 (eight) hours as needed for nausea or vomiting. (Patient not taking: Reported on 09/18/2015) 10 tablet 0   No facility-administered medications prior to visit.    Social History   Social History  . Marital Status: Legally Separated    Spouse Name: N/A  . Number of Children: N/A  . Years of Education: N/A   Social History Main Topics  . Smoking status: Never Smoker   . Smokeless tobacco: None  . Alcohol Use: 0.0 oz/week    0 Standard drinks or equivalent per week     Comment: occ  . Drug Use: No  . Sexual Activity: Not Asked    Other Topics Concern  . None   Social History Narrative     Review of Systems  Constitutional: Negative for fever, chills and appetite change.  HENT: Negative for congestion, ear pain, postnasal drip, sinus pressure and sore throat.   Eyes: Negative for pain and redness.  Respiratory: Negative for cough, shortness of breath and wheezing.   Cardiovascular: Negative for leg swelling.  Gastrointestinal: Negative for nausea, vomiting, abdominal pain, diarrhea, constipation and blood in stool.  Endocrine: Negative for polyuria.  Genitourinary: Positive for urgency, frequency, hematuria and vaginal discharge. Negative for dysuria and flank pain.  Musculoskeletal: Negative for gait problem.  Skin: Negative for rash.  Neurological: Negative for weakness and headaches.  Psychiatric/Behavioral: Negative for confusion and decreased concentration. The patient is not nervous/anxious.     Objective:  BP 120/70 mmHg  Pulse 91  Temp(Src) 98.1 F (36.7 C) (Oral)  Resp 20  Ht 5' 3.5" (1.613 m)  Wt 224 lb (101.606 kg)  BMI 39.05 kg/m2  SpO2 98%  LMP 09/16/2015  Physical Exam  Constitutional: She is oriented to person, place, and time. She appears well-developed and well-nourished. No distress.  HENT:  Head: Normocephalic and atraumatic.  Right Ear: External ear normal.  Left Ear: External ear normal.  Nose: Nose normal.  Eyes: Conjunctivae and EOM are normal. Pupils are equal, round, and reactive to light. No scleral icterus.  Neck: Normal range  of motion. Neck supple. No tracheal deviation present.  Cardiovascular: Normal rate, regular rhythm and normal heart sounds.   Pulmonary/Chest: Effort normal. No respiratory distress. She has no wheezes. She has no rales.  Abdominal: She exhibits no mass. There is no tenderness. There is no rebound and no guarding.  Musculoskeletal: She exhibits no edema.  Lymphadenopathy:    She has no cervical adenopathy.  Neurological: She is alert and  oriented to person, place, and time. Coordination normal.  Skin: Skin is warm and dry. No rash noted.  Psychiatric: She has a normal mood and affect. Her behavior is normal.      Assessment & Plan:   [name] was seen today for hematuria.  Diagnoses and all orders for this visit:  Cystitis  Dysuria -     POCT urinalysis dipstick -     POCT Microscopic Urinalysis ([facility]) -     POCT Wet + KOH Prep ([facility]) -     GC/Chlamydia Probe Amp  Abnormal cervical Papanicolaou smear, unspecified abnormal pap finding -     Ambulatory referral to Gynecology  Other orders -     sulfamethoxazole-trimethoprim (BACTRIM DS,SEPTRA DS) 800-160 MG tablet; Take 1 tablet by mouth 2 (two) times daily.   I am having [name] start on sulfamethoxazole-trimethoprim. I am also having her maintain her ondansetron and cabergoline.  Meds ordered this encounter  Medications  . sulfamethoxazole-trimethoprim (BACTRIM DS,SEPTRA DS) 800-160 MG tablet    Sig: Take 1 tablet by mouth 2 (two) times daily.    Dispense:  20 tablet    Refill:  0    Appropriate red flag conditions were discussed with the patient as well as actions that should be taken.  Patient expressed his understanding.  Follow-up: Return if symptoms worsen or fail to improve.  Roselee Culver, MD   Results for orders placed or performed in visit on 09/20/15  POCT urinalysis dipstick  Result Value Ref Range   Color, UA yellow yellow   Clarity, UA cloudy (A) clear   Glucose, UA negative negative   Bilirubin, UA negative negative   Ketones, POC UA negative negative   Spec Grav, UA 1.025    Blood, UA large (A) negative   pH, UA 5.0    Protein Ur, POC negative negative   Urobilinogen, UA 0.2    Nitrite, UA Negative Negative   Leukocytes, UA Negative Negative  POCT Microscopic Urinalysis ([facility])  Result Value Ref Range   WBC,UR,HPF,POC Few (A) None WBC/hpf   RBC,UR,HPF,POC Moderate (A) None RBC/hpf   Bacteria Few (A) None   Mucus  Absent Absent   Epithelial Cells, UR Per Microscopy Few (A) None cells/hpf  POCT Wet + KOH Prep ([facility])  Result Value Ref Range   Yeast by KOH Absent Present, Absent   Yeast by wet prep Absent Present, Absent   WBC by wet prep Few None, Few   Clue Cells Wet Prep HPF POC Few (A) None   Trich by wet prep Absent Present, Absent   Bacteria Wet Prep HPF POC Few None, Few   Epithelial Cells By [employer] Pref ([facility]) Moderate (A) None, Few   RBC,UR,HPF,POC Few (A) None RBC/hpf

## 2015-09-20 NOTE — Patient Instructions (Signed)

## 2015-09-22 LAB — GC/CHLAMYDIA PROBE AMP
CT Probe RNA: NEGATIVE
GC PROBE AMP APTIMA: NEGATIVE

## 2015-10-25 ENCOUNTER — Emergency Department (HOSPITAL_COMMUNITY)
Admission: EM | Admit: 2015-10-25 | Discharge: 2015-10-26 | Disposition: A | Discharge status: Home / Self Care | Payer: Medicaid Other | Attending: Emergency Medicine | Admitting: Emergency Medicine

## 2015-10-25 ENCOUNTER — Encounter (HOSPITAL_COMMUNITY): Payer: Self-pay | Admitting: Neurology

## 2015-10-25 DIAGNOSIS — E669 Obesity, unspecified: Secondary | ICD-10-CM | POA: Insufficient documentation

## 2015-10-25 DIAGNOSIS — Z87440 Personal history of urinary (tract) infections: Secondary | ICD-10-CM | POA: Diagnosis not present

## 2015-10-25 DIAGNOSIS — Z8619 Personal history of other infectious and parasitic diseases: Secondary | ICD-10-CM | POA: Diagnosis not present

## 2015-10-25 DIAGNOSIS — Z3202 Encounter for pregnancy test, result negative: Secondary | ICD-10-CM | POA: Diagnosis not present

## 2015-10-25 DIAGNOSIS — N72 Inflammatory disease of cervix uteri: Secondary | ICD-10-CM

## 2015-10-25 DIAGNOSIS — R319 Hematuria, unspecified: Secondary | ICD-10-CM | POA: Diagnosis present

## 2015-10-25 LAB — URINALYSIS, ROUTINE W REFLEX MICROSCOPIC
Bilirubin Urine: NEGATIVE
GLUCOSE, UA: NEGATIVE mg/dL
KETONES UR: NEGATIVE mg/dL
LEUKOCYTES UA: NEGATIVE
NITRITE: NEGATIVE
PH: 5 (ref 5.0–8.0)
Protein, ur: NEGATIVE mg/dL
Specific Gravity, Urine: 1.018 (ref 1.005–1.030)

## 2015-10-25 LAB — URINE MICROSCOPIC-ADD ON: RBC / HPF: NONE SEEN RBC/hpf (ref 0–5)

## 2015-10-25 LAB — POC URINE PREG, ED: Preg Test, Ur: NEGATIVE

## 2015-10-25 NOTE — ED Notes (Signed)
Pt reports hematuria for several weeks, went to PCP treated with antibiotic but still has hematuria. Also has rash to hands, is itchy. Denies vaginal discharge, denies pain.

## 2015-10-25 NOTE — ED Provider Notes (Signed)
CSN: [id number]     Arrival date & time 10/25/15  1803 History   First MD Initiated Contact with Patient 10/25/15 2130     Chief Complaint  Patient presents with  . Hematuria     (Consider location/radiation/quality/duration/timing/severity/associated sxs/prior Treatment) HPI Paula Bates is a 39 y.o. female who comes in for evaluation of hematuria. Patient reports over the past 2 weeks, she has had intermittent blood in her urine, noticed the toilet bowl. She reports she was seen one week ago at an urgent care facility and treated for a UTI and also tested for Chlamydia which was negative. She reports taking antibiotics and her symptoms resolved. She finished her antibiotics and noticed yesterday there was some blood in her urine. She reports her last menstrual period was November 1 and was normal for her. She denies any abdominal pain, nausea or vomiting, diarrhea or constipation, fevers or chills, pelvic pain.  History reviewed. No pertinent past medical history. Past Surgical History  Procedure Laterality Date  . Tonsillectomy  1996   Family History  Problem Relation Age of Onset  . Diabetes Mother    Social History  Substance Use Topics  . Smoking status: Never Smoker   . Smokeless tobacco: None  . Alcohol Use: 0.0 oz/week    0 Standard drinks or equivalent per week     Comment: occ   OB History    No data available     Review of Systems A 10 point review of systems was completed and was negative except for pertinent positives and negatives as mentioned in the history of present illness     Allergies  Review of patient's allergies indicates no known allergies.  Home Medications   Prior to Admission medications   Not on File   BP 101/60 mmHg  Pulse 59  Temp(Src) 97.8 F (36.6 C) (Oral)  Resp 16  SpO2 99%  LMP 10/09/2015 Physical Exam  Constitutional: She is oriented to person, place, and time. She appears well-developed and well-nourished.  Obese  African-American female  HENT:  Head: Normocephalic and atraumatic.  Mouth/Throat: Oropharynx is clear and moist.  Eyes: Conjunctivae are normal. Pupils are equal, round, and reactive to light. Right eye exhibits no discharge. Left eye exhibits no discharge. No scleral icterus.  Neck: Neck supple.  Cardiovascular: Normal rate, regular rhythm and normal heart sounds.   Pulmonary/Chest: Effort normal and breath sounds normal. No respiratory distress. She has no wheezes. She has no rales.  Abdominal: Soft. There is no tenderness.  Genitourinary:  Chaperone was present for the entire genital exam. No lesions or rashes appreciated on vulva. Cervix visualized on speculum exam and appropriate cultures sampled. Cervix appears mildly friable. No blood in vaginal vault. Discharge: Small amount of mucus coming from cervix. Upon bi manual exam- No TTP of the adnexa, no cervical motion tenderness. No fullness or masses appreciated. No abnormalities appreciated in structural anatomy.   Musculoskeletal: She exhibits no tenderness.  Neurological: She is alert and oriented to person, place, and time.  Cranial Nerves II-XII grossly intact  Skin: Skin is warm and dry. No rash noted.  Psychiatric: She has a normal mood and affect.  Nursing note and vitals reviewed.   ED Course  Procedures (including critical care time) Labs Review Labs Reviewed  WET PREP, GENITAL - Abnormal; Notable for the following:    WBC, Wet Prep HPF POC MANY (*)    All other components within normal limits  URINALYSIS, ROUTINE W REFLEX MICROSCOPIC (NOT AT  [facility]) - Abnormal; Notable for the following:    Hgb urine dipstick LARGE (*)    All other components within normal limits  URINE MICROSCOPIC-ADD ON - Abnormal; Notable for the following:    Squamous Epithelial / LPF 6-30 (*)    Bacteria, UA RARE (*)    All other components within normal limits  HIV ANTIBODY (ROUTINE TESTING)  POC URINE PREG, ED  GC/CHLAMYDIA PROBE AMP  ([place]) NOT AT [facility]    Imaging Review No results found. I have personally reviewed and evaluated these images and lab results as part of my medical decision-making.   EKG Interpretation None     Meds given in ED:  Medications  cefTRIAXone (ROCEPHIN) injection 250 mg (not administered)  azithromycin (ZITHROMAX) tablet 1,000 mg (not administered)    New Prescriptions   No medications on file   Filed Vitals:   10/25/15 1833 10/25/15 2200 10/25/15 2230  BP: 117/80 127/87 101/60  Pulse: 88 71 59  Temp: 97.8 F (36.6 C)    TempSrc: Oral    Resp: 16    SpO2: 99% 100% 99%    MDM  Paula Bates is a 39 y.o. female who comes in for evaluation of hematuria. On arrival, patient is hemodynamically stable, normal vital signs, afebrile. Reports she has had hematuria for several weeks, was treated one week ago by her PCP with antibiotics but still is experiencing intermittent blood in her urine. Denies any abdominal pain, nausea or vomiting, other unusual vaginal bleeding or discharge, pelvic pain. Physical exam is unremarkable with a benign abdominal exam. However, on pelvic exam she does appear to have a mildly friable cervix with some mucous discharge from the cervix. No other evidence of vaginal trauma or other source of bleeding. Wet prep shows many white cells. Will treat empirically for cervicitis. Pending GC chlamydia labs. Patient given referral to urology for further evaluation of painless hematuria. Also discussed she will need follow-up with her OB/GYN for further evaluation and management of her symptoms. Patient agrees with this plan. No evidence of other acute or emergent pathology at this time. Patient overall appears well and is stable for discharge.  Final diagnoses:  Hematuria  Cervicitis        Comer Locket, PA-C 10/26/15 [id number]  Tanna Furry, MD 11/01/15 [phone]

## 2015-10-26 LAB — WET PREP, GENITAL
Clue Cells Wet Prep HPF POC: NONE SEEN
Sperm: NONE SEEN
Trich, Wet Prep: NONE SEEN
Yeast Wet Prep HPF POC: NONE SEEN

## 2015-10-26 LAB — GC/CHLAMYDIA PROBE AMP (~~LOC~~) NOT AT ARMC
CHLAMYDIA, DNA PROBE: NEGATIVE
NEISSERIA GONORRHEA: NEGATIVE

## 2015-10-26 LAB — HIV ANTIBODY (ROUTINE TESTING W REFLEX): HIV SCREEN 4TH GENERATION: NONREACTIVE

## 2015-10-26 MED ORDER — AZITHROMYCIN 250 MG PO TABS
1000.0000 mg | ORAL_TABLET | Freq: Once | ORAL | Status: AC
  Administered 2015-10-26: 1000 mg via ORAL
  Filled 2015-10-26: qty 4

## 2015-10-26 MED ORDER — LIDOCAINE HCL (PF) 1 % IJ SOLN
INTRAMUSCULAR | Status: AC
  Administered 2015-10-26: 5 mL
  Filled 2015-10-26: qty 5

## 2015-10-26 MED ORDER — CEFTRIAXONE SODIUM 250 MG IJ SOLR
250.0000 mg | Freq: Once | INTRAMUSCULAR | Status: AC
Start: 2015-10-26 — End: 2015-10-26
  Administered 2015-10-26: 250 mg via INTRAMUSCULAR
  Filled 2015-10-26: qty 250

## 2015-10-26 NOTE — Discharge Instructions (Signed)
You were treated in the ED today for your cervicitis with antibiotics. Please follow-up with your OB/GYN for further evaluation and management of your symptoms. You may also follow-up with urology, Dr. Noah Delaine for further evaluation of your hematuria. Return to ED for any new or worsening symptoms.  Cervicitis Cervicitis is a soreness and swelling (inflammation) of the cervix. Your cervix is located at the bottom of your uterus. It opens up to the vagina. CAUSES   Sexually transmitted infections (STIs).   Allergic reaction.   Medicines or birth control devices that are put in the vagina.   Injury to the cervix.   Bacterial infections.  RISK FACTORS You are at greater risk if you:  Have unprotected sexual intercourse.  Have sexual intercourse with many partners.  Began sexual intercourse at an early age.  Have a history of STIs. SYMPTOMS  There may be no symptoms. If symptoms occur, they may include:   Gray, white, yellow, or bad-smelling vaginal discharge.   Pain or itching of the area outside the vagina.   Painful sexual intercourse.   Lower abdominal or lower back pain, especially during intercourse.   Frequent urination.   Abnormal vaginal bleeding between periods, after sexual intercourse, or after menopause.   Pressure or a heavy feeling in the pelvis.  DIAGNOSIS  Diagnosis is made after a pelvic exam. Other tests may include:   Examination of any discharge under a microscope (wet prep).   A Pap test.  TREATMENT  Treatment will depend on the cause of cervicitis. If it is caused by an STI, both you and your partner will need to be treated. Antibiotic medicines will be given.  HOME CARE INSTRUCTIONS   Do not have sexual intercourse until your health care provider says it is okay.   Do not have sexual intercourse until your partner has been treated, if your cervicitis is caused by an STI.   Take your antibiotics as directed. Finish them even  if you start to feel better.  SEEK MEDICAL CARE IF:  Your symptoms come back.   You have a fever.  MAKE SURE YOU:   Understand these instructions.  Will watch your condition.  Will get help right away if you are not doing well or get worse.   This information is not intended to replace advice given to you by your health care provider. Make sure you discuss any questions you have with your health care provider.   Document Released: 11/24/2005 Document Revised: 11/29/2013 Document Reviewed: 05/18/2013 Elsevier Interactive Patient Education 2016 Else[employer]  Hematuria, Adult Hematuria is blood in your urine. It can be caused by a bladder infection, kidney infection, prostate infection, kidney stone, or cancer of your urinary tract. Infections can usually be treated with medicine, and a kidney stone usually will pass through your urine. If neither of these is the cause of your hematuria, further workup to find out the reason may be needed. It is very important that you tell your health care provider about any blood you see in your urine, even if the blood stops without treatment or happens without causing pain. Blood in your urine that happens and then stops and then happens again can be a symptom of a very serious condition. Also, pain is not a symptom in the initial stages of many urinary cancers. HOME CARE INSTRUCTIONS   Drink lots of fluid, 3-4 quarts a day. If you have been diagnosed with an infection, cranberry juice is especially recommended, in addition to large  amounts of water.  Avoid caffeine, tea, and carbonated beverages because they tend to irritate the bladder.  Avoid alcohol because it may irritate the prostate.  Take all medicines as directed by your health care provider.  If you were prescribed an antibiotic medicine, finish it all even if you start to feel better.  If you have been diagnosed with a kidney stone, follow your health care provider's instructions  regarding straining your urine to catch the stone.  Empty your bladder often. Avoid holding urine for long periods of time.  After a bowel movement, women should cleanse front to back. Use each tissue only once.  Empty your bladder before and after sexual intercourse if you are a female. SEEK MEDICAL CARE IF:  You develop back pain.  You have a fever.  You have a feeling of sickness in your stomach (nausea) or vomiting.  Your symptoms are not better in 3 days. Return sooner if you are getting worse. SEEK IMMEDIATE MEDICAL CARE IF:   You develop severe vomiting and are unable to keep the medicine down.  You develop severe back or abdominal pain despite taking your medicines.  You begin passing a large amount of blood or clots in your urine.  You feel extremely weak or faint, or you pass out. MAKE SURE YOU:   Understand these instructions.  Will watch your condition.  Will get help right away if you are not doing well or get worse.   This information is not intended to replace advice given to you by your health care provider. Make sure you discuss any questions you have with your health care provider.   Document Released: 11/24/2005 Document Revised: 12/15/2014 Document Reviewed: 07/25/2013 Elsevier Interactive Patient Education [employer].

## 2016-01-09 HISTORY — PX: BLADDER SURGERY: SHX569

## 2016-04-05 ENCOUNTER — Ambulatory Visit (HOSPITAL_COMMUNITY)
Admission: EM | Admit: 2016-04-05 | Discharge: 2016-04-05 | Disposition: A | Discharge status: Home / Self Care | Payer: Medicaid Other | Attending: Emergency Medicine | Admitting: Emergency Medicine

## 2016-04-05 ENCOUNTER — Encounter (HOSPITAL_COMMUNITY): Payer: Self-pay

## 2016-04-05 DIAGNOSIS — R51 Headache: Secondary | ICD-10-CM

## 2016-04-05 DIAGNOSIS — R519 Headache, unspecified: Secondary | ICD-10-CM

## 2016-04-05 NOTE — ED Provider Notes (Signed)
CSN: [id number]     Arrival date & time 04/05/16  1323 History   First MD Initiated Contact with Patient 04/05/16 1405     Chief Complaint  Patient presents with  . Hypertension   (Consider location/radiation/quality/duration/timing/severity/associated sxs/prior Treatment) HPI  She is a 40 year old woman here for possible blood pressure problems. She states over the last month she has been having intermittent headaches. She denies any prior history of headaches. She was concerned that this was a sign her blood pressure was elevated. She states the headache tends to be in the top of her head. It will typically occur in the afternoon and evenings. It does respond to Tylenol or Excedrin. No associated vision changes, difficulty with speech or swallowing or focal numbness, tingling, weakness. She does state that over the last year she has gained 40 pounds, with 20 pounds in the last 3-4 months.  History reviewed. No pertinent past medical history. Past Surgical History  Procedure Laterality Date  . Tonsillectomy  1996  . Bladder surgery  01/2016   Family History  Problem Relation Age of Onset  . Diabetes Mother    Social History  Substance Use Topics  . Smoking status: Never Smoker   . Smokeless tobacco: Never Used  . Alcohol Use: 0.0 oz/week    0 Standard drinks or equivalent per week     Comment: occ   OB History    No data available     Review of Systems As in history of present illness Allergies  Review of patient's allergies indicates no known allergies.  Home Medications   Prior to Admission medications   Not on File   Meds Ordered and Administered this Visit  Medications - No data to display  BP 133/81 mmHg  Pulse 77  Temp(Src) 98.1 F (36.7 C) (Oral)  Resp 18  SpO2 100%  LMP 03/29/2016 (Exact Date) No data found.   Physical Exam  Constitutional: She is oriented to person, place, and time. She appears well-developed and well-nourished. No distress.  Eyes:  Conjunctivae and EOM are normal. Pupils are equal, round, and reactive to light.  No papilledema  Neck: Neck supple.  Cardiovascular: Normal rate, regular rhythm and normal heart sounds.   No murmur heard. Pulmonary/Chest: Effort normal and breath sounds normal. No respiratory distress. She has no wheezes. She has no rales.  Neurological: She is alert and oriented to person, place, and time. No cranial nerve deficit. She exhibits normal muscle tone. Coordination normal.    ED Course  Procedures (including critical care time)  Labs Review Labs Reviewed - No data to display  Imaging Review No results found.    MDM   1. Nonintractable headache, unspecified chronicity pattern, unspecified headache type    Discussed that her blood pressure is slightly elevated, but not in the hypertension range. I suspect the slight elevation in her blood pressure and her headaches are related to her weight gain. Neurologic exam is normal and headache is without red flags. Recommend diet and exercise changes to help with weight loss. Follow-up with PCP for a recheck in the next several weeks.    Melony Overly, MD 04/05/16 [phone]

## 2016-04-05 NOTE — Discharge Instructions (Signed)
Your blood pressure is slightly elevated, but not enough to say you have hypertension. Your neurologic exam is normal. I suspect your headaches and the increase in your blood pressure are coming from the weight gain. You can take Tylenol, ibuprofen, Excedrin as needed for the headaches. Please work on diet and exercise. Make an appointment with your primary care doctor in the next several weeks for follow-up.

## 2016-04-05 NOTE — ED Notes (Signed)
Patient states she have been having headaches x1 month and her BP has been elevated. Patient has not seen her primary physician in a while she has been out of work. No acute distress

## 2016-12-08 DIAGNOSIS — C679 Malignant neoplasm of bladder, unspecified: Secondary | ICD-10-CM

## 2016-12-08 HISTORY — DX: Malignant neoplasm of bladder, unspecified: C67.9

## 2017-05-20 ENCOUNTER — Encounter: Payer: Self-pay | Admitting: *Deleted

## 2017-06-29 LAB — OB RESULTS CONSOLE GC/CHLAMYDIA
CHLAMYDIA, DNA PROBE: NEGATIVE
Gonorrhea: NEGATIVE

## 2017-06-29 LAB — CULTURE, OB URINE: Urine Culture, OB: NEGATIVE

## 2017-06-29 LAB — OB RESULTS CONSOLE ANTIBODY SCREEN: ANTIBODY SCREEN: POSITIVE

## 2017-06-29 LAB — OB RESULTS CONSOLE ABO/RH

## 2017-06-29 LAB — OB RESULTS CONSOLE HGB/HCT, BLOOD
HEMATOCRIT: 36
Hemoglobin: 11.4

## 2017-06-29 LAB — OB RESULTS CONSOLE RUBELLA ANTIBODY, IGM: RUBELLA: IMMUNE

## 2017-06-29 LAB — ABO AND RH

## 2017-06-29 LAB — CYTOLOGY - PAP: Pap: NEGATIVE

## 2017-06-29 LAB — OB RESULTS CONSOLE HEPATITIS B SURFACE ANTIGEN: Hepatitis B Surface Ag: NEGATIVE

## 2017-07-01 ENCOUNTER — Encounter: Payer: Medicaid Other | Admitting: Family Medicine

## 2017-07-01 ENCOUNTER — Encounter: Payer: Self-pay | Admitting: Family Medicine

## 2017-07-01 NOTE — Progress Notes (Signed)
Patient did not keep appointment today, she will be called to reschedule.

## 2017-07-04 DIAGNOSIS — D414 Neoplasm of uncertain behavior of bladder: Secondary | ICD-10-CM | POA: Insufficient documentation

## 2017-07-13 DIAGNOSIS — D259 Leiomyoma of uterus, unspecified: Secondary | ICD-10-CM | POA: Insufficient documentation

## 2017-08-07 ENCOUNTER — Encounter: Payer: Medicaid Other | Attending: Certified Nurse Midwife | Admitting: Registered"

## 2017-08-07 ENCOUNTER — Encounter: Payer: Self-pay | Admitting: Registered"

## 2017-08-07 DIAGNOSIS — Z713 Dietary counseling and surveillance: Secondary | ICD-10-CM | POA: Insufficient documentation

## 2017-08-07 DIAGNOSIS — Z3A00 Weeks of gestation of pregnancy not specified: Secondary | ICD-10-CM | POA: Diagnosis not present

## 2017-08-07 DIAGNOSIS — O24419 Gestational diabetes mellitus in pregnancy, unspecified control: Secondary | ICD-10-CM | POA: Insufficient documentation

## 2017-08-07 NOTE — Progress Notes (Signed)
Diabetes Self-Management Education  Visit Type: First/Initial  Appt. Start Time: 0815 Appt. End Time: 5188  08/07/2017  [name], identified by name and date of birth, is a 41 y.o. female with a diagnosis of Diabetes: Gestational Diabetes.   ASSESSMENT Pt states she had GDM with previous 2 pregnancies.      Diabetes Self-Management Education - 08/07/17 0859      Visit Information   Visit Type First/Initial     Initial Visit   Diabetes Type Gestational Diabetes   Are you currently following a meal plan? No     Health Coping   How would you rate your overall health? Fair     Psychosocial Assessment   Patient Belief/Attitude about Diabetes Motivated to manage diabetes   How often do you need to have someone help you when you read instructions, pamphlets, or other written materials from your doctor or pharmacy? 1 - Never   What is the last grade level you completed in school? high school + college     Dietary Intake   Breakfast cereal OR pancakes OR oatmeal w sugar OR grits bacon   Snack (morning) popcorn OR fruit (a lot)    Lunch sandwich, peanuts OR oodles of noodles OR w[employer] chicken sandwich, cookout, [employer] salads   Snack (afternoon) nuts, seeds, fruit, popcorn, P&B sandwiches   Dinner fried chicken, pork chop rice & gravy, vegetable   Snack (evening) none OR middle of night cereal, left overs,    Beverage(s) water, cold OJ pulp free     Exercise   Exercise Type ADL's;Other (comment)  walks a lot at work     Patient Education   Previous Diabetes Education No   Monitoring Taught/evaluated SMBG meter.   Acute complications Taught treatment of hypoglycemia - the 15 rule.   Preconception care Reviewed with patient blood glucose goals with pregnancy     Individualized Goals (developed by patient)   Nutrition Follow meal plan discussed   Monitoring  test my blood glucose as discussed     Outcomes   Expected Outcomes Demonstrated interest in learning.  Expect positive outcomes   Future DMSE PRN   Program Status Completed    Individualized Plan for Diabetes Self-Management Training:   Learning Objective:  Patient will have a greater understanding of diabetes self-management. Patient education plan is to attend individual and/or group sessions per assessed needs and concerns.  Meter provided: Accu-Check Guide Lot #: [id number] Exp: 02/25/2018 BG: 158 (2 hrs post meal egg sandwich with OJ)  Patient Instructions  When you have morning highs include a note of what your midnight snack was and what time you ate.  Call your doctors office: 1. Find out what blood sugar number you should be concerned enough to call your doctor or go to the hospital. 2. Ask for a prescription for Accu-Chec Guide strips and lancets  Expected Outcomes:  Demonstrated interest in learning. Expect positive outcomes  Education material provided: Snack sheet, Gestational Diabetes packet, Fish guide  If problems or questions, patient to contact team via:  Phone and MyChart  Future DSME appointment: PRN

## 2017-08-07 NOTE — Patient Instructions (Addendum)
When you have morning highs include a note of what your midnight snack was and what time you ate.  Call your doctors office: 1. Find out what blood sugar number you should be concerned enough to call your doctor or go to the hospital. 2. Ask for a prescription for Accu-Chec Guide strips and lancets

## 2017-08-26 ENCOUNTER — Ambulatory Visit: Payer: Self-pay | Admitting: *Deleted

## 2017-09-09 ENCOUNTER — Encounter: Payer: Medicaid Other | Attending: Certified Nurse Midwife | Admitting: *Deleted

## 2017-09-09 DIAGNOSIS — Z713 Dietary counseling and surveillance: Secondary | ICD-10-CM | POA: Insufficient documentation

## 2017-09-09 DIAGNOSIS — O24419 Gestational diabetes mellitus in pregnancy, unspecified control: Secondary | ICD-10-CM | POA: Diagnosis not present

## 2017-09-09 DIAGNOSIS — Z3A00 Weeks of gestation of pregnancy not specified: Secondary | ICD-10-CM | POA: Insufficient documentation

## 2017-09-09 DIAGNOSIS — R7309 Other abnormal glucose: Secondary | ICD-10-CM

## 2017-09-15 NOTE — Progress Notes (Signed)
Insulin Instruction  Patient was seen on 08/10/2017 for insulin instruction. She states she has taken insulin before with previous pregnancies. She used pens at that time.  The following learning objectives were met by the patient during this visit:   Insulin Action of analog and NPH insulins  Reviewed syringe & vial VS pen including # units per syringe,    length of needles, vial VS Pen cartridge and needles  Hygiene and storage  Drawing up single and mixed doses if using vials   Single dose   Mixed dose: Patient declined mixing insulin instruction. She states she has used pens in the past and would prefer not to mix in syringes   Rotation of Sites  Hypoglycemia- symptoms, causes , treatment choices  Record keeping and MD follow up  Hypoglycemia, causes, symptoms and treatment   Patient demonstrated understanding of insulin administration by return demonstration.  Patient received the following handouts:  Insulin Instruction Handout  Mixing Insulin Brochure by [facility] Getting Started                                        Patient to start on insulin as Rx'd by MD  Patient will be seen for follow-up as needed.  

## 2017-09-22 ENCOUNTER — Encounter: Payer: Self-pay | Admitting: *Deleted

## 2017-10-12 ENCOUNTER — Other Ambulatory Visit (HOSPITAL_COMMUNITY)
Admission: RE | Admit: 2017-10-12 | Discharge: 2017-10-12 | Disposition: A | Discharge status: Home / Self Care | Payer: Medicaid Other | Source: Ambulatory Visit | Attending: Obstetrics and Gynecology | Admitting: Obstetrics and Gynecology

## 2017-10-12 ENCOUNTER — Ambulatory Visit (INDEPENDENT_AMBULATORY_CARE_PROVIDER_SITE_OTHER): Payer: Medicaid Other | Admitting: Obstetrics and Gynecology

## 2017-10-12 ENCOUNTER — Encounter: Payer: Self-pay | Admitting: Obstetrics and Gynecology

## 2017-10-12 VITALS — BP 114/66 | HR 108 | Wt 243.8 lb

## 2017-10-12 DIAGNOSIS — O0993 Supervision of high risk pregnancy, unspecified, third trimester: Secondary | ICD-10-CM | POA: Diagnosis present

## 2017-10-12 DIAGNOSIS — O09213 Supervision of pregnancy with history of pre-term labor, third trimester: Secondary | ICD-10-CM

## 2017-10-12 DIAGNOSIS — Z113 Encounter for screening for infections with a predominantly sexual mode of transmission: Secondary | ICD-10-CM | POA: Diagnosis not present

## 2017-10-12 DIAGNOSIS — Z8632 Personal history of gestational diabetes: Secondary | ICD-10-CM

## 2017-10-12 DIAGNOSIS — O09899 Supervision of other high risk pregnancies, unspecified trimester: Secondary | ICD-10-CM

## 2017-10-12 DIAGNOSIS — O09293 Supervision of pregnancy with other poor reproductive or obstetric history, third trimester: Secondary | ICD-10-CM

## 2017-10-12 DIAGNOSIS — O9989 Other specified diseases and conditions complicating pregnancy, childbirth and the puerperium: Secondary | ICD-10-CM | POA: Diagnosis not present

## 2017-10-12 DIAGNOSIS — O24419 Gestational diabetes mellitus in pregnancy, unspecified control: Secondary | ICD-10-CM | POA: Insufficient documentation

## 2017-10-12 DIAGNOSIS — O09299 Supervision of pregnancy with other poor reproductive or obstetric history, unspecified trimester: Secondary | ICD-10-CM

## 2017-10-12 DIAGNOSIS — D414 Neoplasm of uncertain behavior of bladder: Secondary | ICD-10-CM

## 2017-10-12 DIAGNOSIS — N898 Other specified noninflammatory disorders of vagina: Secondary | ICD-10-CM

## 2017-10-12 DIAGNOSIS — Z8751 Personal history of pre-term labor: Secondary | ICD-10-CM

## 2017-10-12 DIAGNOSIS — O24414 Gestational diabetes mellitus in pregnancy, insulin controlled: Secondary | ICD-10-CM

## 2017-10-12 DIAGNOSIS — Z8759 Personal history of other complications of pregnancy, childbirth and the puerperium: Secondary | ICD-10-CM

## 2017-10-12 DIAGNOSIS — B009 Herpesviral infection, unspecified: Secondary | ICD-10-CM | POA: Insufficient documentation

## 2017-10-12 DIAGNOSIS — O09219 Supervision of pregnancy with history of pre-term labor, unspecified trimester: Secondary | ICD-10-CM

## 2017-10-12 HISTORY — DX: Gestational diabetes mellitus in pregnancy, unspecified control: O24.419

## 2017-10-12 HISTORY — DX: Personal history of other complications of pregnancy, childbirth and the puerperium: Z87.59

## 2017-10-12 HISTORY — DX: Personal history of pre-term labor: Z87.51

## 2017-10-12 LAB — POCT URINALYSIS DIP (DEVICE)
BILIRUBIN URINE: NEGATIVE
Glucose, UA: NEGATIVE mg/dL
Nitrite: NEGATIVE
Protein, ur: 30 mg/dL — AB
SPECIFIC GRAVITY, URINE: 1.025 (ref 1.005–1.030)
Urobilinogen, UA: 0.2 mg/dL (ref 0.0–1.0)
pH: 5.5 (ref 5.0–8.0)

## 2017-10-12 MED ORDER — INSULIN NPH (HUMAN) (ISOPHANE) 100 UNIT/ML ~~LOC~~ SUSP: 18.0000 [IU] | Freq: Two times a day (BID) | SUBCUTANEOUS | 11 refills | Status: DC

## 2017-10-12 MED ORDER — CLOTRIMAZOLE 1 % VA CREA: 1.0000 | TOPICAL_CREAM | Freq: Every day | VAGINAL | 0 refills | Status: DC

## 2017-10-12 MED ORDER — INSULIN ASPART 100 UNIT/ML ~~LOC~~ SOLN: 24.0000 [IU] | Freq: Three times a day (TID) | SUBCUTANEOUS | 12 refills | Status: DC

## 2017-10-12 NOTE — Progress Notes (Addendum)
PRENATAL VISIT NOTE  Subjective:  Paula Bates is a 41 y.o. [phone] at [redacted]w[redacted]d being seen today for ongoing prenatal care.  She is currently monitored for the following issues for this high-risk pregnancy and has Prolactinoma ([place]); Obesity, unspecified; HSV infection; H/O gestational diabetes in prior pregnancy, currently pregnant; Supervision of high risk pregnancy, antepartum, third trimester; Herpes simplex infection; H/O pre-eclampsia in prior pregnancy, currently pregnant; and H/O preterm delivery, currently pregnant on their problem list.   Patient reports vaginal irritation and itching.  Contractions: Not present. Vag. Bleeding: Large.  Movement: Present. Denies leaking of fluid.   Patient is transfer from [place], started receiving care at 12 weeks. Reports early [place] c/w dates and had anatomy scan. Reports as far she knows, everything is normal.  H/o gestational diabetes x2 in prior pregnancies, states her post partum test was borderline DM.  States with pre-term delivery, cervix "started opening" around 20 weeks and was put on bedrest in hospital for 2 months, then delivered. Was not on progesterone/cerclage in prior pregnancy, declined this pregnancy.  On humalog 12 units BID (has been increasing herself, was told by OBGYN to do 4 units) FG: 150-165 BG: 250-300  OB History  Gravida Para Term Preterm AB Living  4 2 1 1 1 2   SAB TAB Ectopic Multiple Live Births  1       2    # Outcome Date GA Lbr Len/2nd Weight Sex Delivery Anes PTL Lv  4 Current           3 Term 04/15/11 [redacted]w[redacted]d  7 lb 6 oz (3.345 kg) M Vag-Spont EPI N LIV  2 SAB 2011          1 Preterm 04/21/06 [redacted]w[redacted]d  4 lb 4 oz (1.928 kg) F Vag-Spont EPI Y LIV      The following portions of the patient's history were reviewed and updated as appropriate: allergies, current medications, past family history, past medical history, past social history, past surgical history and problem list. Problem list  updated.  Objective:   Vitals:   10/12/17 0819  BP: 114/66  Pulse: (!) 108  Weight: 243 lb 12.8 oz (110.6 kg)    Fetal Status: Fetal Heart Rate (bpm): 145   Movement: Present     General:  Alert, oriented and cooperative. Patient is in no acute distress.  Skin: Skin is warm and dry. No rash noted.   Cardiovascular: Normal heart rate noted  Respiratory: Normal respiratory effort, no problems with respiration noted  Abdomen: Soft, gravid, appropriate for gestational age.  Pain/Pressure: Present     Pelvic: Cervical exam deferred      thick white discharge, vulva tender to palpation with irritation  Extremities: Normal range of motion.  Edema: Trace  Mental Status:  Normal mood and affect. Normal behavior. Normal judgment and thought content.   Assessment and Plan:  Pregnancy: [id number] at [redacted]w[redacted]d  1. H/O gestational diabetes in prior pregnancy, currently pregnant Very uncontrolled, patient has been increasing her own insulin dose She is comfortable with testing and giving herself injections, declined to see diabetic counselor  Will add long acting insulin regimen NPH 18 units BID novolog 24 units TID AC Patient needs diabetic supplies, but unsure which lancets she uses, will call with glucometer F/u growth [place] ordered for uncontrolled diabetes  2. Supervision of high risk pregnancy, antepartum, third trimester ? Tubal Counseled today, she will think about it  3. Herpes simplex infection Has never had outbreak, diagnosed by PCP  4. H/O pre-eclampsia in prior pregnancy, currently pregnant Immediately pp, not on baby ASA  5. H/O preterm delivery, currently pregnant Declined progesterone this pregnancy  6. Yeast infection Clotrimazole sent to pharmacy for yeast infection Wet prep sent  Preterm labor symptoms and general obstetric precautions including but not limited to vaginal bleeding, contractions, leaking of fluid and fetal movement were reviewed in detail with the  patient. Please refer to After Visit Summary for other counseling recommendations.  Return in about 1 week (around 10/19/2017) for 2 hr GTT, OB visit (MD).   Sloan Leiter, MD

## 2017-10-12 NOTE — Addendum Note (Signed)
Addended by: Wendelyn Breslow L on: 10/12/2017 10:43 AM   Modules accepted: Orders

## 2017-10-13 DIAGNOSIS — D573 Sickle-cell trait: Secondary | ICD-10-CM

## 2017-10-13 HISTORY — DX: Sickle-cell trait: D57.3

## 2017-10-13 LAB — CERVICOVAGINAL ANCILLARY ONLY
Bacterial vaginitis: NEGATIVE
CANDIDA VAGINITIS: POSITIVE — AB
Chlamydia: NEGATIVE
Neisseria Gonorrhea: NEGATIVE
Trichomonas: NEGATIVE

## 2017-10-15 ENCOUNTER — Encounter (HOSPITAL_COMMUNITY): Payer: Self-pay

## 2017-10-15 ENCOUNTER — Other Ambulatory Visit: Payer: Self-pay | Admitting: Obstetrics and Gynecology

## 2017-10-15 ENCOUNTER — Ambulatory Visit (HOSPITAL_COMMUNITY)
Admission: RE | Admit: 2017-10-15 | Discharge: 2017-10-15 | Disposition: A | Discharge status: Home / Self Care | Payer: Medicaid Other | Source: Ambulatory Visit | Attending: Obstetrics and Gynecology | Admitting: Obstetrics and Gynecology

## 2017-10-15 DIAGNOSIS — O09523 Supervision of elderly multigravida, third trimester: Secondary | ICD-10-CM | POA: Insufficient documentation

## 2017-10-15 DIAGNOSIS — Z3A28 28 weeks gestation of pregnancy: Secondary | ICD-10-CM

## 2017-10-15 DIAGNOSIS — Z3689 Encounter for other specified antenatal screening: Secondary | ICD-10-CM | POA: Diagnosis present

## 2017-10-15 DIAGNOSIS — B009 Herpesviral infection, unspecified: Secondary | ICD-10-CM

## 2017-10-15 DIAGNOSIS — O09299 Supervision of pregnancy with other poor reproductive or obstetric history, unspecified trimester: Secondary | ICD-10-CM

## 2017-10-15 DIAGNOSIS — O09213 Supervision of pregnancy with history of pre-term labor, third trimester: Secondary | ICD-10-CM | POA: Insufficient documentation

## 2017-10-15 DIAGNOSIS — Z8632 Personal history of gestational diabetes: Principal | ICD-10-CM

## 2017-10-15 DIAGNOSIS — D573 Sickle-cell trait: Secondary | ICD-10-CM

## 2017-10-15 DIAGNOSIS — O99213 Obesity complicating pregnancy, third trimester: Secondary | ICD-10-CM

## 2017-10-15 DIAGNOSIS — O0993 Supervision of high risk pregnancy, unspecified, third trimester: Secondary | ICD-10-CM

## 2017-10-15 DIAGNOSIS — E669 Obesity, unspecified: Secondary | ICD-10-CM | POA: Diagnosis not present

## 2017-10-15 DIAGNOSIS — O09219 Supervision of pregnancy with history of pre-term labor, unspecified trimester: Secondary | ICD-10-CM

## 2017-10-15 DIAGNOSIS — O09899 Supervision of other high risk pregnancies, unspecified trimester: Secondary | ICD-10-CM

## 2017-10-15 DIAGNOSIS — O09893 Supervision of other high risk pregnancies, third trimester: Secondary | ICD-10-CM | POA: Diagnosis not present

## 2017-10-15 DIAGNOSIS — O24414 Gestational diabetes mellitus in pregnancy, insulin controlled: Secondary | ICD-10-CM | POA: Diagnosis present

## 2017-10-15 HISTORY — DX: Malignant neoplasm of bladder, unspecified: C67.9

## 2017-10-19 ENCOUNTER — Other Ambulatory Visit: Payer: Medicaid Other

## 2017-10-19 ENCOUNTER — Other Ambulatory Visit (HOSPITAL_COMMUNITY): Payer: Self-pay | Admitting: *Deleted

## 2017-10-19 DIAGNOSIS — O24414 Gestational diabetes mellitus in pregnancy, insulin controlled: Secondary | ICD-10-CM

## 2017-10-19 DIAGNOSIS — E559 Vitamin D deficiency, unspecified: Secondary | ICD-10-CM | POA: Insufficient documentation

## 2017-10-26 ENCOUNTER — Encounter: Payer: Self-pay | Admitting: Obstetrics and Gynecology

## 2017-10-26 ENCOUNTER — Ambulatory Visit (INDEPENDENT_AMBULATORY_CARE_PROVIDER_SITE_OTHER): Payer: Medicaid Other | Admitting: Obstetrics and Gynecology

## 2017-10-26 VITALS — BP 129/77 | HR 106 | Wt 247.2 lb

## 2017-10-26 DIAGNOSIS — B009 Herpesviral infection, unspecified: Secondary | ICD-10-CM

## 2017-10-26 DIAGNOSIS — E559 Vitamin D deficiency, unspecified: Secondary | ICD-10-CM

## 2017-10-26 DIAGNOSIS — O09299 Supervision of pregnancy with other poor reproductive or obstetric history, unspecified trimester: Secondary | ICD-10-CM

## 2017-10-26 DIAGNOSIS — O09899 Supervision of other high risk pregnancies, unspecified trimester: Secondary | ICD-10-CM

## 2017-10-26 DIAGNOSIS — D573 Sickle-cell trait: Secondary | ICD-10-CM

## 2017-10-26 DIAGNOSIS — O09219 Supervision of pregnancy with history of pre-term labor, unspecified trimester: Secondary | ICD-10-CM

## 2017-10-26 DIAGNOSIS — O0993 Supervision of high risk pregnancy, unspecified, third trimester: Secondary | ICD-10-CM

## 2017-10-26 DIAGNOSIS — O24410 Gestational diabetes mellitus in pregnancy, diet controlled: Secondary | ICD-10-CM

## 2017-10-26 LAB — POCT URINALYSIS DIP (DEVICE)
BILIRUBIN URINE: NEGATIVE
GLUCOSE, UA: NEGATIVE mg/dL
HGB URINE DIPSTICK: NEGATIVE
KETONES UR: NEGATIVE mg/dL
LEUKOCYTES UA: NEGATIVE
Nitrite: NEGATIVE
Protein, ur: NEGATIVE mg/dL
Urobilinogen, UA: 0.2 mg/dL (ref 0.0–1.0)
pH: 5.5 (ref 5.0–8.0)

## 2017-10-26 NOTE — Patient Instructions (Addendum)
Keep morning/evening NPH the same 18 units in the morning and at night.   Third Trimester of Pregnancy The third trimester is from week 29 through week 42, months 7 through 9. This trimester is when your unborn baby (fetus) is growing very fast. At the end of the ninth month, the unborn baby is about 20 inches in length. It weighs about 6-10 pounds. Follow these instructions at home:  Avoid all smoking, herbs, and alcohol. Avoid drugs not approved by your doctor.  Do not use any tobacco products, including cigarettes, chewing tobacco, and electronic cigarettes. If you need help quitting, ask your doctor. You may get counseling or other support to help you quit.  Only take medicine as told by your doctor. Some medicines are safe and some are not during pregnancy.  Exercise only as told by your doctor. Stop exercising if you start having cramps.  Eat regular, healthy meals.  Wear a good support bra if your breasts are tender.  Do not use hot tubs, steam rooms, or saunas.  Wear your seat belt when driving.  Avoid raw meat, uncooked cheese, and liter boxes and soil used by cats.  Take your prenatal vitamins.  Take 1500-2000 milligrams of calcium daily starting at the 20th week of pregnancy until you deliver your baby.  Try taking medicine that helps you poop (stool softener) as needed, and if your doctor approves. Eat more fiber by eating fresh fruit, vegetables, and whole grains. Drink enough fluids to keep your pee (urine) clear or pale yellow.  Take warm water baths (sitz baths) to soothe pain or discomfort caused by hemorrhoids. Use hemorrhoid cream if your doctor approves.  If you have puffy, bulging veins (varicose veins), wear support hose. Raise (elevate) your feet for 15 minutes, 3-4 times a day. Limit salt in your diet.  Avoid heavy lifting, wear low heels, and sit up straight.  Rest with your legs raised if you have leg cramps or low back pain.  Visit your dentist if you  have not gone during your pregnancy. Use a soft toothbrush to brush your teeth. Be gentle when you floss.  You can have sex (intercourse) unless your doctor tells you not to.  Do not travel far distances unless you must. Only do so with your doctor's approval.  Take prenatal classes.  Practice driving to the hospital.  Pack your hospital bag.  Prepare the baby's room.  Go to your doctor visits. Get help if:  You are not sure if you are in labor or if your water has broken.  You are dizzy.  You have mild cramps or pressure in your lower belly (abdominal).  You have a nagging pain in your belly area.  You continue to feel sick to your stomach (nauseous), throw up (vomit), or have watery poop (diarrhea).  You have bad smelling fluid coming from your vagina.  You have pain with peeing (urination). Get help right away if:  You have a fever.  You are leaking fluid from your vagina.  You are spotting or bleeding from your vagina.  You have severe belly cramping or pain.  You lose or gain weight rapidly.  You have trouble catching your breath and have chest pain.  You notice sudden or extreme puffiness (swelling) of your face, hands, ankles, feet, or legs.  You have not felt the baby move in over an hour.  You have severe headaches that do not go away with medicine.  You have vision changes. This information  is not intended to replace advice given to you by your health care provider. Make sure you discuss any questions you have with your health care provider. Document Released: 02/18/2010 Document Revised: 05/01/2016 Document Reviewed: 01/25/2013 Elsevier Interactive Patient Education  2017 [place] to have your son circumcised:    [facility] [phone] while you are in hospital  [facility] [phone] $244 by 4 wks    [facility] [phone] $175 by 2 wks  [facility] [phone] $250 by 7 days [facility] [phone] $150 by 4 wks  These prices sometimes change but are roughly what you can expect to pay. Please call and confirm pricing.   Circumcision is considered an elective/non-medically necessary procedure. There are many reasons parents decide to have their sons circumsized. During the first year of life circumcised males have a reduced risk of urinary tract infections but after this year the rates between circumcised males and uncircumcised males are the same.  It is safe to have your son circumcised outside of the hospital and the places above perform them regularly.

## 2017-10-26 NOTE — Progress Notes (Signed)
Declines tdap today. C/o headaches most everyday.  Took asa 325mg  once. Denies visual disturbances.

## 2017-10-26 NOTE — Progress Notes (Signed)
   PRENATAL VISIT NOTE  Subjective:  Paula Bates is a 41 y.o. [phone] at [redacted]w[redacted]d being seen today for ongoing prenatal care.  She is currently monitored for the following issues for this high-risk pregnancy and has Prolactinoma ([place]); Obesity, unspecified; HSV infection; Gestational diabetes; Supervision of high risk pregnancy, antepartum, third trimester; Herpes simplex infection; H/O pre-eclampsia in prior pregnancy, currently pregnant; H/O preterm delivery, currently pregnant; Sickle cell trait ([place]); Papilloma, bladder urinary; Vitamin D deficiency; and Uterine leiomyoma on their problem list.  Patient reports headache that improves on its own  Contractions: Not present. Vag. Bleeding: None.  Movement: Present. Denies leaking of fluid.   NPH 18 units BID novolog 24 units TID AC FG: 120s PP: 120-130 She does report she gets up in the middle of the night and eats a bowl of cereal or a peanut butter sandwich in the middle of the night.  The following portions of the patient's history were reviewed and updated as appropriate: allergies, current medications, past family history, past medical history, past social history, past surgical history and problem list. Problem list updated.  Objective:   Vitals:   10/26/17 0806  BP: 129/77  Pulse: (!) 106  Weight: 247 lb 3.2 oz (112.1 kg)    Fetal Status: Fetal Heart Rate (bpm): 142   Movement: Present     General:  Alert, oriented and cooperative. Patient is in no acute distress.  Skin: Skin is warm and dry. No rash noted.   Cardiovascular: Normal heart rate noted  Respiratory: Normal respiratory effort, no problems with respiration noted  Abdomen: Soft, gravid, appropriate for gestational age.  Pain/Pressure: Absent     Pelvic: Cervical exam deferred        Extremities: Normal range of motion.  Edema: Trace  Mental Status:  Normal mood and affect. Normal behavior. Normal judgment and thought content.   Assessment and Plan:  Pregnancy:  [id number] at [redacted]w[redacted]d  1. Diet controlled gestational diabetes mellitus (GDM), antepartum NPH 18 units BID novolog 24 units TID AC Fasting elevated but she reports snacks (cereal/sandwiches) in the middle of the night. Reviewed need for increase in nightly insulin and she reports middle of the night snacks, she should like to try eating better/less in the night rather than increase insulin  2. Supervision of high risk pregnancy, antepartum, third trimester Has conversation about primary c-section, patient concerned about difficult second delivery Reviewed risks/benefits of primary c-section, she is likely for SVD but will think about it.  3. H/O preterm delivery, currently pregnant Declined progesterone (started with [facility])  4. H/O pre-eclampsia in prior pregnancy, currently pregnant No on baby ASA Has been having headaches that improve on their won rec tylenol for HA Reviewed s/s PEC   5. HSV infection ppx at 34 weeks  6. Sickle cell trait ([facility]) Urine culture Q trimester  7. Vitamin D deficiency    Preterm labor symptoms and general obstetric precautions including but not limited to vaginal bleeding, contractions, leaking of fluid and fetal movement were reviewed in detail with the patient. Please refer to After Visit Summary for other counseling recommendations.  Return in about 2 weeks (around 11/09/2017) for OB visit (MD).   Sloan Leiter, MD

## 2017-11-09 ENCOUNTER — Encounter: Payer: Self-pay | Admitting: Obstetrics and Gynecology

## 2017-11-09 ENCOUNTER — Ambulatory Visit (INDEPENDENT_AMBULATORY_CARE_PROVIDER_SITE_OTHER): Payer: Medicaid Other | Admitting: Obstetrics and Gynecology

## 2017-11-09 ENCOUNTER — Other Ambulatory Visit (HOSPITAL_COMMUNITY)
Admission: RE | Admit: 2017-11-09 | Discharge: 2017-11-09 | Disposition: A | Discharge status: Home / Self Care | Payer: Medicaid Other | Source: Ambulatory Visit | Attending: Obstetrics and Gynecology | Admitting: Obstetrics and Gynecology

## 2017-11-09 VITALS — BP 129/66 | HR 104 | Wt 250.7 lb

## 2017-11-09 DIAGNOSIS — B009 Herpesviral infection, unspecified: Secondary | ICD-10-CM

## 2017-11-09 DIAGNOSIS — O09299 Supervision of pregnancy with other poor reproductive or obstetric history, unspecified trimester: Secondary | ICD-10-CM

## 2017-11-09 DIAGNOSIS — N898 Other specified noninflammatory disorders of vagina: Secondary | ICD-10-CM

## 2017-11-09 DIAGNOSIS — O09213 Supervision of pregnancy with history of pre-term labor, third trimester: Secondary | ICD-10-CM

## 2017-11-09 DIAGNOSIS — O09899 Supervision of other high risk pregnancies, unspecified trimester: Secondary | ICD-10-CM

## 2017-11-09 DIAGNOSIS — O24410 Gestational diabetes mellitus in pregnancy, diet controlled: Secondary | ICD-10-CM

## 2017-11-09 DIAGNOSIS — O0993 Supervision of high risk pregnancy, unspecified, third trimester: Secondary | ICD-10-CM

## 2017-11-09 DIAGNOSIS — O09219 Supervision of pregnancy with history of pre-term labor, unspecified trimester: Secondary | ICD-10-CM

## 2017-11-09 DIAGNOSIS — O09293 Supervision of pregnancy with other poor reproductive or obstetric history, third trimester: Secondary | ICD-10-CM

## 2017-11-09 NOTE — Progress Notes (Signed)
Pt c/o headache x 1 week, does not respond to Tylenol. Occasional blurry vision. No RUQ pain. Also C/o watery discharge which started last night while Christmas shopping. Declines TDaP, unsure about BTL yet.

## 2017-11-09 NOTE — Patient Instructions (Signed)
Take one aspirin (81 mg) once per day.

## 2017-11-09 NOTE — Progress Notes (Signed)
   PRENATAL VISIT NOTE  Subjective:  Paula Bates is a 41 y.o. [phone] at [redacted]w[redacted]d being seen today for ongoing prenatal care.  She is currently monitored for the following issues for this high-risk pregnancy and has Prolactinoma ([place]); Obesity, unspecified; HSV infection; Gestational diabetes; Supervision of high risk pregnancy, antepartum, third trimester; Herpes simplex infection; H/O pre-eclampsia in prior pregnancy, currently pregnant; H/O preterm delivery, currently pregnant; Sickle cell trait ([place]); Papilloma, bladder urinary; Vitamin D deficiency; and Uterine leiomyoma on their problem list.  Patient reports headache, generally not feeling well, contractions.  Contractions: Not present. Vag. Bleeding: None.  Movement: Present. Denies leaking of fluid.   NPH 18 units BID novolog 24 units TID AC FG: 100s PP: 120-130  The following portions of the patient's history were reviewed and updated as appropriate: allergies, current medications, past family history, past medical history, past social history, past surgical history and problem list. Problem list updated.  Objective:   Vitals:   11/09/17 1456  BP: 129/66  Pulse: (!) 104  Weight: 250 lb 11.2 oz (113.7 kg)    Fetal Status: Fetal Heart Rate (bpm): 145   Movement: Present     General:  Alert, oriented and cooperative. Patient is in no acute distress.  Skin: Skin is warm and dry. No rash noted.   Cardiovascular: Normal heart rate noted  Respiratory: Normal respiratory effort, no problems with respiration noted  Abdomen: Soft, gravid, appropriate for gestational age.  Pain/Pressure: Present     Pelvic: Cervical exam deferred      SSE: cervix normal appearing, negative pooling, negative ferning  Extremities: Normal range of motion.  Edema: Mild pitting, slight indentation  Mental Status:  Normal mood and affect. Normal behavior. Normal judgment and thought content.   Assessment and Plan:  Pregnancy: [id number] at [redacted]w[redacted]d  1.  Insulin controlled gestational diabetes mellitus (GDM), antepartum Does not have logs today, encouraged her to remember BG improved from last visit as she has been snacking overnight less F/u US scheduled for 12/6 Weekly NST/BPP to start NPH 18 units BID, increase to 20 units QHS novolog 24 units TID AC  2. HSV infection ppx 34 weeks  3. Supervision of high risk pregnancy, antepartum, third trimester Anatomy EFW 75th%tile  4. H/O pre-eclampsia in prior pregnancy, currently pregnant Not taking bASA, reminded again to start  5. H/O preterm delivery, currently pregnant Declined 17P  6. Vaginal discharge No evidence for ROM Wet prep sent today  Preterm labor symptoms and general obstetric precautions including but not limited to vaginal bleeding, contractions, leaking of fluid and fetal movement were reviewed in detail with the patient. Please refer to After Visit Summary for other counseling recommendations.  Return in about 1 week (around 11/16/2017) for OB visit.   Sloan Leiter, MD

## 2017-11-10 LAB — CERVICOVAGINAL ANCILLARY ONLY
Bacterial vaginitis: NEGATIVE
CHLAMYDIA, DNA PROBE: NEGATIVE
Candida vaginitis: NEGATIVE
Neisseria Gonorrhea: NEGATIVE
TRICH (WINDOWPATH): NEGATIVE

## 2017-11-12 ENCOUNTER — Encounter (HOSPITAL_COMMUNITY): Payer: Self-pay

## 2017-11-12 ENCOUNTER — Ambulatory Visit (HOSPITAL_COMMUNITY)
Admission: RE | Admit: 2017-11-12 | Discharge: 2017-11-12 | Disposition: A | Discharge status: Home / Self Care | Payer: Medicaid Other | Source: Ambulatory Visit | Attending: Obstetrics and Gynecology | Admitting: Obstetrics and Gynecology

## 2017-11-12 DIAGNOSIS — O2693 Pregnancy related conditions, unspecified, third trimester: Secondary | ICD-10-CM | POA: Insufficient documentation

## 2017-11-12 DIAGNOSIS — Z3A32 32 weeks gestation of pregnancy: Secondary | ICD-10-CM | POA: Insufficient documentation

## 2017-11-12 DIAGNOSIS — O99213 Obesity complicating pregnancy, third trimester: Secondary | ICD-10-CM | POA: Insufficient documentation

## 2017-11-12 DIAGNOSIS — Z862 Personal history of diseases of the blood and blood-forming organs and certain disorders involving the immune mechanism: Secondary | ICD-10-CM | POA: Insufficient documentation

## 2017-11-12 DIAGNOSIS — O09299 Supervision of pregnancy with other poor reproductive or obstetric history, unspecified trimester: Secondary | ICD-10-CM

## 2017-11-12 DIAGNOSIS — O24414 Gestational diabetes mellitus in pregnancy, insulin controlled: Secondary | ICD-10-CM | POA: Insufficient documentation

## 2017-11-12 DIAGNOSIS — O09523 Supervision of elderly multigravida, third trimester: Secondary | ICD-10-CM | POA: Diagnosis not present

## 2017-11-12 DIAGNOSIS — O09213 Supervision of pregnancy with history of pre-term labor, third trimester: Secondary | ICD-10-CM | POA: Diagnosis not present

## 2017-11-12 DIAGNOSIS — O09219 Supervision of pregnancy with history of pre-term labor, unspecified trimester: Secondary | ICD-10-CM

## 2017-11-12 DIAGNOSIS — O09899 Supervision of other high risk pregnancies, unspecified trimester: Secondary | ICD-10-CM

## 2017-11-12 DIAGNOSIS — B009 Herpesviral infection, unspecified: Secondary | ICD-10-CM

## 2017-11-12 DIAGNOSIS — O0993 Supervision of high risk pregnancy, unspecified, third trimester: Secondary | ICD-10-CM

## 2017-11-12 DIAGNOSIS — D573 Sickle-cell trait: Secondary | ICD-10-CM

## 2017-11-12 NOTE — ED Notes (Addendum)
Pt here today in [place] for appt.  BP 145/79 Left, 151/84 in Rt upper arm with size appropriate cuff.  Pt denies HA, visual changes, or epigastric pain.  Pt has noticed increased swelling in hands, feet and ankles.  BP rechecked at end of visit, 128/73, HR 93 in left arm.

## 2017-11-13 ENCOUNTER — Other Ambulatory Visit (HOSPITAL_COMMUNITY): Payer: Self-pay | Admitting: *Deleted

## 2017-11-13 DIAGNOSIS — O24119 Pre-existing diabetes mellitus, type 2, in pregnancy, unspecified trimester: Secondary | ICD-10-CM

## 2017-11-13 DIAGNOSIS — Z794 Long term (current) use of insulin: Principal | ICD-10-CM

## 2017-11-16 ENCOUNTER — Other Ambulatory Visit: Payer: Medicaid Other

## 2017-11-16 ENCOUNTER — Encounter: Payer: Medicaid Other | Admitting: Obstetrics & Gynecology

## 2017-11-17 ENCOUNTER — Telehealth: Payer: Self-pay | Admitting: *Deleted

## 2017-11-17 NOTE — Telephone Encounter (Signed)
Called pt and left message stating that I am calling to check on her since she was not able to have her appt yesterday due to our office being closed due to snow. I wanted to be sure she is feeling good fetal movement daily and see if she has any questions related to her care. She may call back if she has questions or concerns. Otherwise she should keep next appt as scheduled on 12/17 @ 1440.

## 2017-11-23 ENCOUNTER — Ambulatory Visit: Payer: Self-pay

## 2017-11-23 ENCOUNTER — Ambulatory Visit (INDEPENDENT_AMBULATORY_CARE_PROVIDER_SITE_OTHER): Payer: Medicaid Other | Admitting: Obstetrics and Gynecology

## 2017-11-23 ENCOUNTER — Encounter: Payer: Medicaid Other | Admitting: Obstetrics and Gynecology

## 2017-11-23 ENCOUNTER — Ambulatory Visit (INDEPENDENT_AMBULATORY_CARE_PROVIDER_SITE_OTHER): Payer: Medicaid Other | Admitting: *Deleted

## 2017-11-23 VITALS — BP 136/76 | HR 102 | Wt 256.3 lb

## 2017-11-23 DIAGNOSIS — O24414 Gestational diabetes mellitus in pregnancy, insulin controlled: Secondary | ICD-10-CM

## 2017-11-23 DIAGNOSIS — O0993 Supervision of high risk pregnancy, unspecified, third trimester: Secondary | ICD-10-CM

## 2017-11-23 DIAGNOSIS — O09299 Supervision of pregnancy with other poor reproductive or obstetric history, unspecified trimester: Secondary | ICD-10-CM

## 2017-11-23 DIAGNOSIS — O09219 Supervision of pregnancy with history of pre-term labor, unspecified trimester: Secondary | ICD-10-CM

## 2017-11-23 DIAGNOSIS — B009 Herpesviral infection, unspecified: Secondary | ICD-10-CM

## 2017-11-23 DIAGNOSIS — Z8632 Personal history of gestational diabetes: Secondary | ICD-10-CM

## 2017-11-23 DIAGNOSIS — O09899 Supervision of other high risk pregnancies, unspecified trimester: Secondary | ICD-10-CM

## 2017-11-23 MED ORDER — INSULIN NPH (HUMAN) (ISOPHANE) 100 UNIT/ML ~~LOC~~ SUSP: 20.0000 [IU] | Freq: Every day | SUBCUTANEOUS | 3 refills | Status: DC

## 2017-11-23 MED ORDER — INSULIN NPH (HUMAN) (ISOPHANE) 100 UNIT/ML ~~LOC~~ SUSP: 18.0000 [IU] | Freq: Every day | SUBCUTANEOUS | 11 refills | Status: DC

## 2017-11-23 MED ORDER — VALACYCLOVIR HCL 500 MG PO TABS: 500.0000 mg | ORAL_TABLET | Freq: Two times a day (BID) | ORAL | 6 refills | Status: DC

## 2017-11-23 NOTE — Progress Notes (Signed)

## 2017-11-23 NOTE — Progress Notes (Signed)
   PRENATAL VISIT NOTE  Subjective:  Paula Bates is a 41 y.o. [phone] at [redacted]w[redacted]d being seen today for ongoing prenatal care.  She is currently monitored for the following issues for this high-risk pregnancy and has Prolactinoma ([place]); Obesity, unspecified; HSV infection; Gestational diabetes; Supervision of high risk pregnancy, antepartum, third trimester; Herpes simplex infection; H/O pre-eclampsia in prior pregnancy, currently pregnant; H/O preterm delivery, currently pregnant; Sickle cell trait ([place]); Papilloma, bladder urinary; Vitamin D deficiency; and Uterine leiomyoma on their problem list.  Patient reports no complaints.  Contractions: Not present. Vag. Bleeding: None.  Movement: Present. Denies leaking of fluid.   NPH 18 units am, 20 units QHS - patient not taking this regimen, has been taking NPH 18 units BID novolog 24 units TID AC FG:100s PP:120-130  The following portions of the patient's history were reviewed and updated as appropriate: allergies, current medications, past family history, past medical history, past social history, past surgical history and problem list. Problem list updated.  Objective:   Vitals:   11/23/17 1506  BP: 136/76  Pulse: (!) 102  Weight: 256 lb 4.8 oz (116.3 kg)    Fetal Status: Fetal Heart Rate (bpm): NST   Movement: Present     General:  Alert, oriented and cooperative. Patient is in no acute distress.  Skin: Skin is warm and dry. No rash noted.   Cardiovascular: Normal heart rate noted  Respiratory: Normal respiratory effort, no problems with respiration noted  Abdomen: Soft, gravid, appropriate for gestational age.  Pain/Pressure: Present     Pelvic: Cervical exam deferred        Extremities: Normal range of motion.  Edema: Trace  Mental Status:  Normal mood and affect. Normal behavior. Normal judgment and thought content.   Assessment and Plan:  Pregnancy: [id number] at [redacted]w[redacted]d  1. Supervision of high risk pregnancy, antepartum,  third trimester  2. Insulin controlled gestational diabetes mellitus (GDM) in third trimester Had increased NPH to 20 units at night, patient did not increase Fasting still elevated Will increase to NPH 20 units QHS Weekly NST/BPP Today BPP 10/10  3. HSV infection Start valtrex today Script sent to pharmacy for ppx  4. H/O pre-eclampsia in prior pregnancy, currently pregnant Not taking baby ASA, takes it if she has a headache  5. H/O preterm delivery, currently pregnant Declined 17P  Preterm labor symptoms and general obstetric precautions including but not limited to vaginal bleeding, contractions, leaking of fluid and fetal movement were reviewed in detail with the patient. Please refer to After Visit Summary for other counseling recommendations.  Return in about 2 weeks (around 12/10/2017) for NST and [facility] - has [place] @ 1545, OB visit (MD).   Sloan Leiter, MD

## 2017-11-28 ENCOUNTER — Inpatient Hospital Stay (HOSPITAL_COMMUNITY)
Admission: AD | Admit: 2017-11-28 | Discharge: 2017-11-28 | Disposition: A | Discharge status: Home / Self Care | Payer: Medicaid Other | Source: Ambulatory Visit | Attending: Obstetrics and Gynecology | Admitting: Obstetrics and Gynecology

## 2017-11-28 ENCOUNTER — Encounter (HOSPITAL_COMMUNITY): Payer: Self-pay | Admitting: *Deleted

## 2017-11-28 DIAGNOSIS — O09899 Supervision of other high risk pregnancies, unspecified trimester: Secondary | ICD-10-CM

## 2017-11-28 DIAGNOSIS — O98513 Other viral diseases complicating pregnancy, third trimester: Secondary | ICD-10-CM | POA: Diagnosis not present

## 2017-11-28 DIAGNOSIS — O09893 Supervision of other high risk pregnancies, third trimester: Secondary | ICD-10-CM | POA: Diagnosis present

## 2017-11-28 DIAGNOSIS — B009 Herpesviral infection, unspecified: Secondary | ICD-10-CM | POA: Insufficient documentation

## 2017-11-28 DIAGNOSIS — O09219 Supervision of pregnancy with history of pre-term labor, unspecified trimester: Secondary | ICD-10-CM | POA: Insufficient documentation

## 2017-11-28 DIAGNOSIS — O09213 Supervision of pregnancy with history of pre-term labor, third trimester: Secondary | ICD-10-CM

## 2017-11-28 DIAGNOSIS — O09293 Supervision of pregnancy with other poor reproductive or obstetric history, third trimester: Secondary | ICD-10-CM | POA: Diagnosis not present

## 2017-11-28 DIAGNOSIS — N3941 Urge incontinence: Secondary | ICD-10-CM

## 2017-11-28 DIAGNOSIS — D573 Sickle-cell trait: Secondary | ICD-10-CM

## 2017-11-28 DIAGNOSIS — Z87891 Personal history of nicotine dependence: Secondary | ICD-10-CM | POA: Insufficient documentation

## 2017-11-28 DIAGNOSIS — D571 Sickle-cell disease without crisis: Secondary | ICD-10-CM | POA: Diagnosis not present

## 2017-11-28 DIAGNOSIS — O99013 Anemia complicating pregnancy, third trimester: Secondary | ICD-10-CM | POA: Diagnosis not present

## 2017-11-28 DIAGNOSIS — Z3A34 34 weeks gestation of pregnancy: Secondary | ICD-10-CM | POA: Insufficient documentation

## 2017-11-28 DIAGNOSIS — O0993 Supervision of high risk pregnancy, unspecified, third trimester: Secondary | ICD-10-CM | POA: Diagnosis not present

## 2017-11-28 DIAGNOSIS — O09299 Supervision of pregnancy with other poor reproductive or obstetric history, unspecified trimester: Secondary | ICD-10-CM

## 2017-11-28 LAB — POCT FERN TEST: POCT FERN TEST: NEGATIVE

## 2017-11-28 NOTE — MAU Note (Signed)
Pt reports she had fluid leak out about 15 min ago. Denies any contraction reports some pelvic pressure. Good fetal movent reported.

## 2017-11-28 NOTE — MAU Provider Note (Signed)
History   [id number] @ 34.6 wks in with c/o being at work and not sure if she lost bladder control or started leaking her BOW. This was isolated episode. Denies vag bleeing  CSN: [id number]  Arrival date & time 11/28/17  1804   None     Chief Complaint  Patient presents with  . Rupture of Membranes    HPI  Past Medical History:  Diagnosis Date  . Bladder cancer ([place]) 12/2016  . Diabetes mellitus without complication (HCC)    gestational diabetes  . Herpes simplex infection    has never had outbreak, was tested in blood by family physician  . Hx of tonsillectomy    41 years old    Past Surgical History:  Procedure Laterality Date  . BLADDER SURGERY  01/2016  . TONSILLECTOMY  1996    Family History  Problem Relation Age of Onset  . Diabetes Mother     Social History   Tobacco Use  . Smoking status: Former Smoker    Packs/day: 0.25    Types: Cigarettes    Last attempt to quit: 10/15/2010    Years since quitting: 7.1  . Smokeless tobacco: Never Used  Substance Use Topics  . Alcohol use: No    Alcohol/week: 0.0 oz    Frequency: Never    Comment: occ  . Drug use: No    OB History    Gravida Para Term Preterm AB Living   4 2 1 1 1 2    SAB TAB Ectopic Multiple Live Births   1       2      Review of Systems  Constitutional: Negative.   HENT: Negative.   Eyes: Negative.   Respiratory: Negative.   Cardiovascular: Negative.   Gastrointestinal:       Pelvic pressure  Endocrine: Negative.   Genitourinary: Negative.   Musculoskeletal: Negative.   Skin: Negative.   Allergic/Immunologic: Negative.   Neurological: Negative.   Hematological: Negative.   Psychiatric/Behavioral: Negative.     Allergies  Patient has no known allergies.  Home Medications    BP 139/68   Pulse 81   LMP 03/29/2017 (Exact Date)   Physical Exam  Constitutional: She is oriented to person, place, and time. She appears well-developed and well-nourished.  HENT:  Head:  Normocephalic.  Eyes: Pupils are equal, round, and reactive to light.  Neck: Normal range of motion.  Cardiovascular: Normal rate, regular rhythm, normal heart sounds and intact distal pulses.  Pulmonary/Chest: Effort normal and breath sounds normal.  Abdominal: Soft. Bowel sounds are normal.  Genitourinary: Vagina normal and uterus normal.  Musculoskeletal: Normal range of motion.  Neurological: She is alert and oriented to person, place, and time. She has normal reflexes.  Skin: Skin is warm and dry.  Psychiatric: She has a normal mood and affect. Her behavior is normal. Judgment and thought content normal.    MAU Course  Procedures (including critical care time)  Labs Reviewed - No data to display No results found.   1. Sickle cell trait ([place])   2. HSV infection   3. Supervision of high risk pregnancy, antepartum, third trimester   4. H/O pre-eclampsia in prior pregnancy, currently pregnant   5. H/O preterm delivery, currently pregnant       MDM  VSS, FHR pattern reassurring 15x15 accels, no decels, mod variability. Sterile spec exam no leaking with valsalva or cough, fern test neg. Will d/c home with reactive NST and no further leaking.

## 2017-11-28 NOTE — Discharge Instructions (Signed)
Urinary Incontinence Urinary incontinence is the involuntary loss of urine from your bladder. What are the causes? There are many causes of urinary incontinence. They include:  Medicines.  Infections.  Prostatic enlargement, leading to overflow of urine from your bladder.  Surgery.  Neurological diseases.  Emotional factors.  What are the signs or symptoms? Urinary Incontinence can be divided into four types: 1. Urge incontinence. Urge incontinence is the involuntary loss of urine before you have the opportunity to go to the bathroom. There is a sudden urge to void but not enough time to reach a bathroom. 2. Stress incontinence. Stress incontinence is the sudden loss of urine with any activity that forces urine to pass. It is commonly caused by anatomical changes to the pelvis and sphincter areas of your body. 3. Overflow incontinence. Overflow incontinence is the loss of urine from an obstructed opening to your bladder. This results in a backup of urine and a resultant buildup of pressure within the bladder. When the pressure within the bladder exceeds the closing pressure of the sphincter, the urine overflows, which causes incontinence, similar to water overflowing a dam. 4. Total incontinence. Total incontinence is the loss of urine as a result of the inability to store urine within your bladder.  How is this diagnosed? Evaluating the cause of incontinence may require:  A thorough and complete medical and obstetric history.  A complete physical exam.  Laboratory tests such as a urine culture and sensitivities.  When additional tests are indicated, they can include:  An ultrasound exam.  Kidney and bladder X-rays.  Cystoscopy. This is an exam of the bladder using a narrow scope.  Urodynamic testing to test the nerve function to the bladder and sphincter areas.  How is this treated? Treatment for urinary incontinence depends on the cause:  For urge incontinence caused  by a bacterial infection, antibiotics will be prescribed. If the urge incontinence is related to medicines you take, your health care provider may have you change the medicine.  For stress incontinence, surgery to re-establish anatomical support to the bladder or sphincter, or both, will often correct the condition.  For overflow incontinence caused by an enlarged prostate, an operation to open the channel through the enlarged prostate will allow the flow of urine out of the bladder. In women with fibroids, a hysterectomy may be recommended.  For total incontinence, surgery on your urinary sphincter may help. An artificial urinary sphincter (an inflatable cuff placed around the urethra) may be required. In women who have developed a hole-like passage between their bladder and vagina (vesicovaginal fistula), surgery to close the fistula often is required.  Follow these instructions at home:  Normal daily hygiene and the use of pads or adult diapers that are changed regularly will help prevent odors and skin damage.  Avoid caffeine. It can overstimulate your bladder.  Use the bathroom regularly. Try about every 2-3 hours to go to the bathroom, even if you do not feel the need to do so. Take time to empty your bladder completely. After urinating, wait a minute. Then try to urinate again.  For causes involving nerve dysfunction, keep a log of the medicines you take and a journal of the times you go to the bathroom. Contact a health care provider if:  You experience worsening of pain instead of improvement in pain after your procedure.  Your incontinence becomes worse instead of better. Get help right away if:  You experience fever or shaking chills.  You are unable to   pass your urine.  You have redness spreading into your groin or down into your thighs. This information is not intended to replace advice given to you by your health care provider. Make sure you discuss any questions you have  with your health care provider. Document Released: 01/01/2005 Document Revised: 07/04/2016 Document Reviewed: 05/03/2013 Elsevier Interactive Patient Education  2018 Elsevier Inc.  

## 2017-12-02 ENCOUNTER — Other Ambulatory Visit: Payer: Medicaid Other

## 2017-12-02 ENCOUNTER — Encounter: Payer: Medicaid Other | Admitting: Obstetrics & Gynecology

## 2017-12-02 ENCOUNTER — Telehealth: Payer: Self-pay | Admitting: Obstetrics & Gynecology

## 2017-12-02 NOTE — Telephone Encounter (Signed)
Patient called to cancel her appointment for today. Asked when her next appointment was. Informed her it would be on 12/31, but you would not be seeing the provider that day. Patient stated that would be fine. When asked why she was canceling her appointment, she stated because she wanted to.

## 2017-12-07 ENCOUNTER — Encounter: Payer: Medicaid Other | Admitting: Obstetrics and Gynecology

## 2017-12-07 ENCOUNTER — Ambulatory Visit (INDEPENDENT_AMBULATORY_CARE_PROVIDER_SITE_OTHER): Payer: Medicaid Other | Admitting: *Deleted

## 2017-12-07 VITALS — BP 136/75 | HR 96

## 2017-12-07 DIAGNOSIS — O24414 Gestational diabetes mellitus in pregnancy, insulin controlled: Secondary | ICD-10-CM | POA: Diagnosis present

## 2017-12-07 NOTE — Progress Notes (Signed)
[place] for growth And BPP scheduled on 12/10/17.

## 2017-12-09 NOTE — Progress Notes (Signed)
NST Note Date: 12/07/2017 Gestational Age: 40/1 FHT: 135 baseline, positive accelerations, negative deceleration, moderate variability Toco: rare UCs Time: 20 minutes  A/P: rNST. Continue with current plan of care.   Durene Romans MD Attending Center for [employer])

## 2017-12-10 ENCOUNTER — Other Ambulatory Visit: Payer: Self-pay | Admitting: Obstetrics and Gynecology

## 2017-12-10 ENCOUNTER — Ambulatory Visit (INDEPENDENT_AMBULATORY_CARE_PROVIDER_SITE_OTHER): Payer: Medicaid Other | Admitting: Family Medicine

## 2017-12-10 ENCOUNTER — Ambulatory Visit (INDEPENDENT_AMBULATORY_CARE_PROVIDER_SITE_OTHER): Payer: Medicaid Other | Admitting: *Deleted

## 2017-12-10 ENCOUNTER — Inpatient Hospital Stay (HOSPITAL_COMMUNITY)
Admission: AD | Admit: 2017-12-10 | Discharge: 2017-12-10 | Disposition: A | Discharge status: Home / Self Care | Payer: Medicaid Other | Source: Ambulatory Visit | Attending: Obstetrics & Gynecology | Admitting: Obstetrics & Gynecology

## 2017-12-10 ENCOUNTER — Ambulatory Visit (HOSPITAL_COMMUNITY)
Admission: RE | Admit: 2017-12-10 | Discharge: 2017-12-10 | Disposition: A | Discharge status: Home / Self Care | Payer: Medicaid Other | Source: Ambulatory Visit | Attending: Obstetrics and Gynecology | Admitting: Obstetrics and Gynecology

## 2017-12-10 ENCOUNTER — Encounter (HOSPITAL_COMMUNITY): Payer: Self-pay | Admitting: *Deleted

## 2017-12-10 ENCOUNTER — Other Ambulatory Visit (HOSPITAL_COMMUNITY): Payer: Self-pay | Admitting: Maternal and Fetal Medicine

## 2017-12-10 ENCOUNTER — Encounter (HOSPITAL_COMMUNITY): Payer: Self-pay

## 2017-12-10 ENCOUNTER — Other Ambulatory Visit: Payer: Self-pay

## 2017-12-10 ENCOUNTER — Other Ambulatory Visit (HOSPITAL_COMMUNITY)
Admission: RE | Admit: 2017-12-10 | Discharge: 2017-12-10 | Disposition: A | Discharge status: Home / Self Care | Payer: Medicaid Other | Source: Ambulatory Visit | Attending: Family Medicine | Admitting: Family Medicine

## 2017-12-10 VITALS — BP 123/75 | HR 117 | Wt 263.1 lb

## 2017-12-10 DIAGNOSIS — O139 Gestational [pregnancy-induced] hypertension without significant proteinuria, unspecified trimester: Secondary | ICD-10-CM | POA: Diagnosis present

## 2017-12-10 DIAGNOSIS — O133 Gestational [pregnancy-induced] hypertension without significant proteinuria, third trimester: Secondary | ICD-10-CM | POA: Diagnosis not present

## 2017-12-10 DIAGNOSIS — O2690 Pregnancy related conditions, unspecified, unspecified trimester: Secondary | ICD-10-CM

## 2017-12-10 DIAGNOSIS — O0993 Supervision of high risk pregnancy, unspecified, third trimester: Secondary | ICD-10-CM | POA: Diagnosis present

## 2017-12-10 DIAGNOSIS — O09899 Supervision of other high risk pregnancies, unspecified trimester: Secondary | ICD-10-CM

## 2017-12-10 DIAGNOSIS — O24414 Gestational diabetes mellitus in pregnancy, insulin controlled: Secondary | ICD-10-CM

## 2017-12-10 DIAGNOSIS — B009 Herpesviral infection, unspecified: Secondary | ICD-10-CM

## 2017-12-10 DIAGNOSIS — O3413 Maternal care for benign tumor of corpus uteri, third trimester: Secondary | ICD-10-CM

## 2017-12-10 DIAGNOSIS — Z3A36 36 weeks gestation of pregnancy: Secondary | ICD-10-CM | POA: Insufficient documentation

## 2017-12-10 DIAGNOSIS — D259 Leiomyoma of uterus, unspecified: Secondary | ICD-10-CM | POA: Diagnosis not present

## 2017-12-10 DIAGNOSIS — Z862 Personal history of diseases of the blood and blood-forming organs and certain disorders involving the immune mechanism: Secondary | ICD-10-CM | POA: Insufficient documentation

## 2017-12-10 DIAGNOSIS — O09523 Supervision of elderly multigravida, third trimester: Secondary | ICD-10-CM | POA: Insufficient documentation

## 2017-12-10 DIAGNOSIS — O09219 Supervision of pregnancy with history of pre-term labor, unspecified trimester: Secondary | ICD-10-CM

## 2017-12-10 DIAGNOSIS — O24119 Pre-existing diabetes mellitus, type 2, in pregnancy, unspecified trimester: Secondary | ICD-10-CM

## 2017-12-10 DIAGNOSIS — Z87891 Personal history of nicotine dependence: Secondary | ICD-10-CM | POA: Insufficient documentation

## 2017-12-10 DIAGNOSIS — O09299 Supervision of pregnancy with other poor reproductive or obstetric history, unspecified trimester: Secondary | ICD-10-CM

## 2017-12-10 DIAGNOSIS — O2693 Pregnancy related conditions, unspecified, third trimester: Secondary | ICD-10-CM | POA: Insufficient documentation

## 2017-12-10 DIAGNOSIS — D573 Sickle-cell trait: Secondary | ICD-10-CM

## 2017-12-10 DIAGNOSIS — Z8551 Personal history of malignant neoplasm of bladder: Secondary | ICD-10-CM | POA: Insufficient documentation

## 2017-12-10 DIAGNOSIS — Z794 Long term (current) use of insulin: Secondary | ICD-10-CM

## 2017-12-10 DIAGNOSIS — O99213 Obesity complicating pregnancy, third trimester: Secondary | ICD-10-CM

## 2017-12-10 DIAGNOSIS — O163 Unspecified maternal hypertension, third trimester: Secondary | ICD-10-CM | POA: Diagnosis present

## 2017-12-10 HISTORY — DX: Unspecified abnormal cytological findings in specimens from vagina: R87.629

## 2017-12-10 HISTORY — DX: Gestational (pregnancy-induced) hypertension without significant proteinuria, unspecified trimester: O13.9

## 2017-12-10 HISTORY — DX: Unspecified infectious disease: B99.9

## 2017-12-10 LAB — COMPREHENSIVE METABOLIC PANEL
ALBUMIN: 3 g/dL — AB (ref 3.5–5.0)
ALT: 29 U/L (ref 14–54)
ANION GAP: 9 (ref 5–15)
AST: 21 U/L (ref 15–41)
Alkaline Phosphatase: 147 U/L — ABNORMAL HIGH (ref 38–126)
BILIRUBIN TOTAL: 0.3 mg/dL (ref 0.3–1.2)
BUN: 8 mg/dL (ref 6–20)
CALCIUM: 8.8 mg/dL — AB (ref 8.9–10.3)
CO2: 21 mmol/L — ABNORMAL LOW (ref 22–32)
Chloride: 104 mmol/L (ref 101–111)
Creatinine, Ser: 0.63 mg/dL (ref 0.44–1.00)
GLUCOSE: 100 mg/dL — AB (ref 65–99)
Potassium: 3.6 mmol/L (ref 3.5–5.1)
Sodium: 134 mmol/L — ABNORMAL LOW (ref 135–145)
TOTAL PROTEIN: 6.6 g/dL (ref 6.5–8.1)

## 2017-12-10 LAB — PROTEIN / CREATININE RATIO, URINE
Creatinine, Urine: 59 mg/dL
PROTEIN CREATININE RATIO: 0.1 mg/mg{creat} (ref 0.00–0.15)
Total Protein, Urine: 6 mg/dL

## 2017-12-10 LAB — CBC
HEMATOCRIT: 34.1 % — AB (ref 36.0–46.0)
Hemoglobin: 11.3 g/dL — ABNORMAL LOW (ref 12.0–15.0)
MCH: 24.7 pg — AB (ref 26.0–34.0)
MCHC: 33.1 g/dL (ref 30.0–36.0)
MCV: 74.6 fL — AB (ref 78.0–100.0)
Platelets: 192 10*3/uL (ref 150–400)
RBC: 4.57 MIL/uL (ref 3.87–5.11)
RDW: 15.5 % (ref 11.5–15.5)
WBC: 8 10*3/uL (ref 4.0–10.5)

## 2017-12-10 MED ORDER — BETAMETHASONE SOD PHOS & ACET 6 (3-3) MG/ML IJ SUSP
12.0000 mg | Freq: Once | INTRAMUSCULAR | Status: DC
  Filled 2017-12-10: qty 2

## 2017-12-10 NOTE — ED Notes (Signed)
Pt here today in [place].  Elevated BP's at check in and after U/S exam.  Pt to go to MAU for further evaluation and monitoring per Dr. Leonides Sake.  Mingo Amber, Rn given report.  Pt ambulated and checked into MAU.

## 2017-12-10 NOTE — Progress Notes (Signed)
   PRENATAL VISIT NOTE  Subjective:  Paula Bates is a 42 y.o. [phone] at [redacted]w[redacted]d being seen today for ongoing prenatal care.  She is currently monitored for the following issues for this high-risk pregnancy and has Prolactinoma ([place]); Obesity, unspecified; HSV infection; Gestational diabetes; Supervision of high risk pregnancy, antepartum, third trimester; Herpes simplex infection; H/O pre-eclampsia in prior pregnancy, currently pregnant; H/O preterm delivery, currently pregnant; Sickle cell trait ([place]); Papilloma, bladder urinary; Vitamin D deficiency; and Uterine leiomyoma on their problem list.  Patient reports no complaints.  Contractions: Not present. Vag. Bleeding: None.  Movement: Present. Denies leaking of fluid.   The following portions of the patient's history were reviewed and updated as appropriate: allergies, current medications, past family history, past medical history, past social history, past surgical history and problem list. Problem list updated.  Objective:   Vitals:   12/10/17 1429  BP: 123/75  Pulse: (!) 117  Weight: 263 lb 1.6 oz (119.3 kg)    Fetal Status: Fetal Heart Rate (bpm): NST   Movement: Present     General:  Alert, oriented and cooperative. Patient is in no acute distress.  Skin: Skin is warm and dry. No rash noted.   Cardiovascular: Normal heart rate noted  Respiratory: Normal respiratory effort, no problems with respiration noted  Abdomen: Soft, gravid, appropriate for gestational age.  Pain/Pressure: Present     Pelvic: Cervical exam deferred        Extremities: Normal range of motion.     Mental Status:  Normal mood and affect. Normal behavior. Normal judgment and thought content.  No book today--patient reports FBS 100's depending if she eats in the night 2 hour pp 120-125-- NST:  Baseline: 120 bpm, Variability: Good {> 6 bpm), Accelerations: Reactive and Decelerations: Absent   Assessment and Plan:  Pregnancy: [id number] at [redacted]w[redacted]d  1.  Supervision of high risk pregnancy, antepartum, third trimester Cultures today - Culture, beta strep (group b only) - GC/Chlamydia probe amp ([place])not at [facility]  2. Insulin controlled gestational diabetes mellitus (GDM) in third trimester Continue insulin in appropriate doses Antepartum testing with BPP today in MFM  3. [place] Offered all options, she wants primary C-section.  Preterm labor symptoms and general obstetric precautions including but not limited to vaginal bleeding, contractions, leaking of fluid and fetal movement were reviewed in detail with the patient. Please refer to After Visit Summary for other counseling recommendations.  Return in about 1 week (around 12/17/2017) for NST/BPP and HOB.   Donnamae Jude, MD

## 2017-12-10 NOTE — Progress Notes (Signed)
Pt states she is taking Novolog insulin - 24 units TID w/meals.  [place] for growth and BPP today @ 1545.

## 2017-12-10 NOTE — MAU Note (Signed)
Pt sent from MFM for BP eval.  ^BP in MFM x3.  Denies H/A, visual disturbances, or epigastric pain.  Reports increase swelling in lower extremities.  Denies VB or LOF.  Reports +FM.

## 2017-12-10 NOTE — Patient Instructions (Signed)
Gestational Diabetes Mellitus, Diagnosis  Gestational diabetes (gestational diabetes mellitus) is a temporary form of diabetes that some women develop during pregnancy. It usually occurs around weeks 24-28 of pregnancy and goes away after delivery. Hormonal changes during pregnancy can interfere with insulin production and function, which may result in one or both of these problems:   The pancreas does not make enough of a hormone called insulin.   Cells in the body do not respond properly to insulin that the body makes (insulin resistance).    Normally, insulin allows sugars (glucose) to enter cells in the body. The cells use glucose for energy. Insulin resistance or lack of insulin causes excess glucose to build up in the blood instead of going into cells. As a result, high blood glucose (hyperglycemia) develops.  What are the risks?  If gestational diabetes is treated, it is unlikely to cause problems. If it is not controlled with treatment, it may cause problems during labor and delivery, and some of those problems can be harmful to the unborn baby (fetus) and the mother. Uncontrolled gestational diabetes may also cause the newborn baby to have breathing problems and low blood glucose.  Women who get gestational diabetes are more likely to develop it if they get pregnant again, and they are more likely to develop type 2 diabetes in the future.  What increases the risk?  This condition may be more likely to develop in pregnant women who:   Are older than age 25 during pregnancy.   Have a family history of diabetes.   Are overweight.   Had gestational diabetes in the past.   Have polycystic ovarian syndrome (PCOS).   Are pregnant with twins or multiples.   Are of American-Indian, African-American, Hispanic/Latino, or Asian/Pacific Islander descent.    What are the signs or symptoms?  Most women do not notice symptoms of gestational diabetes because the symptoms are similar to normal symptoms of pregnancy.  Symptoms of gestational diabetes may include:   Increased thirst (polydipsia).   Increased hunger(polyphagia).   Increased urination (polyuria).    How is this diagnosed?    This condition may be diagnosed based on your blood glucose level, which may be checked with one or more of the following blood tests:   A fasting blood glucose (FBG) test. You will not be allowed to eat (you will fast) for at least 8 hours before a blood sample is taken.   A random blood glucose test. This checks your blood glucose at any time of day regardless of when you ate.   An oral glucose tolerance test (OGTT). This is usually done during weeks 24-28 of pregnancy.  ? For this test, you will have an FBG test done. Then, you will drink a beverage that contains glucose. Your blood glucose will be tested again 1 hour after drinking the glucose beverage (1-hour OGTT).  ? If the 1-hour OGTT result is at or above 140 mg/dL (7.8 mmol/L), you will repeat the OGTT. This time, your blood glucose will be tested 3 hours after drinking the glucose beverage (3-hour OGTT).    If you have risk factors, you may be screened for undiagnosed type 2 diabetes at your first health care visit during your pregnancy (prenatal visit).  How is this treated?    Your treatment may be managed by a specialist called an endocrinologist. This condition is treated by following instructions from your health care provider about:   Eating a healthier diet and getting more physical activity.   These changes are the most important ways to manage gestational diabetes.   Checking your blood glucose. Do this as often as told.   Taking diabetes medicines or insulin every day. These will only be prescribed if they are needed.  ? If you use insulin, you may need to adjust your dosage based on how physically active you are and what foods you eat. Your health care provider will tell you how to do this.    Your health care provider will set treatment goals for you based on the  stage of your pregnancy and any other medical conditions you have. Generally, the goal of treatment is to maintain the following blood glucose levels during pregnancy:   Fasting: at or below 95 mg/dL (5.3 mmol/L).   After meals (postprandial):  ? One hour after a meal: at or below 140 mg/dL (7.8 mmol/L).  ? Two hours after a meal: at or below 120 mg/dL (6.7 mmol/L).   A1c (hemoglobin A1c) level: 6-6.5%.    Follow these instructions at home:   Take over-the-counter and prescription medicines only as told by your health care provider.   Manage your weight gain during pregnancy. The amount of weight that you are expected to gain depends on your pre-pregnancy BMI (body mass index).   Keep all follow-up visits as told by your health care provider. This is important.  Consider asking your health care provider these questions:     Do I need to meet with a diabetes educator?   Where can I find a support group for people with diabetes?   What equipment will I need to manage my diabetes at home?   What diabetes medicines do I need, and when should I take them?   How often do I need to check my blood glucose?   What number can I call if I have questions?   When is my next appointment?  Where to find more information:   For more information about diabetes, visit:  ? American Diabetes Association (ADA): www.diabetes.org  ? American Association of Diabetes Educators (AADE): www.diabeteseducator.org/patient-resources  Contact a health care provider if:   Your blood glucose level is at or above 240 mg/dL (13.3 mmol/L).   Your blood glucose level is at or above 200 mg/dL (11.1 mmol/L) and you have ketones in your urine.   You have been sick or have had a fever for 2 days or more and you are not getting better.   You have any of the following problems for more than 6 hours:  ? You cannot eat or drink.  ? You have nausea and vomiting.  ? You have diarrhea.  Get help right away if:   Your blood glucose is below 54  mg/dL (3 mmol/L).   You become confused or you have trouble thinking clearly.   You have difficulty breathing.   You have moderate or large ketone levels in your urine.   Your baby is moving around less than usual.   You develop unusual discharge or bleeding from your vagina.   You start having contractions early (prematurely). Contractions may feel like a tightening in your lower abdomen.  This information is not intended to replace advice given to you by your health care provider. Make sure you discuss any questions you have with your health care provider.  Document Released: 03/02/2001 Document Revised: 05/01/2016 Document Reviewed: 12/28/2015  Elsevier Interactive Patient Education  2018 Elsevier Inc.

## 2017-12-10 NOTE — MAU Provider Note (Signed)
History     CSN: [id number]  Arrival date and time: 12/10/17 1716   First Provider Initiated Contact with Patient 12/10/17 1810      Chief Complaint  Patient presents with  . Hypertension   HPI  Ms.  Paula Bates is a 42 y.o. year old [phone] female at [redacted]w[redacted]d weeks gestation who sent to MAU from [facility]FM for elevated BPs. She is a GDM on insulin with a h/o PEC, but not taking baby ASA.  The patient was seen at [facility] earlier today with normal BPs. She reports good (+) FM.  Past Medical History:  Diagnosis Date  . Bladder cancer ([place]) 12/2016  . Diabetes mellitus without complication (HCC)    gestational diabetes  . Herpes simplex infection    has never had outbreak, was tested in blood by family physician  . Hx of tonsillectomy    42 years old  . Infection    UTI  . Pregnancy induced hypertension   . Vaginal Pap smear, abnormal    cryotherapy, ok since    Past Surgical History:  Procedure Laterality Date  . BLADDER SURGERY  01/2016  . TONSILLECTOMY  1996    Family History  Problem Relation Age of Onset  . Diabetes Mother   . Hypertension Mother     Social History   Tobacco Use  . Smoking status: Former Smoker    Packs/day: 0.25    Types: Cigarettes    Last attempt to quit: 10/15/2010    Years since quitting: 7.1  . Smokeless tobacco: Never Used  Substance Use Topics  . Alcohol use: No    Alcohol/week: 0.0 oz    Frequency: Never    Comment: occ  . Drug use: No    Allergies: No Known Allergies  No medications prior to admission.    Review of Systems  Constitutional: Negative.   HENT: Negative.   Eyes: Negative.   Respiratory: Negative.   Cardiovascular: Negative.   Gastrointestinal: Negative.   Endocrine: Negative.   Genitourinary: Negative.   Musculoskeletal: Negative.   Skin: Negative.   Allergic/Immunologic: Negative.   Neurological: Negative.   Hematological: Negative.   Psychiatric/Behavioral: Negative.    Physical Exam   Patient  Vitals for the past 24 hrs:  BP Temp Temp src Pulse Resp SpO2 Height Weight  12/10/17 1957 (!) 142/77 - - 93 18 - - -  12/10/17 1907 (!) 156/73 - - 92 - - - -  12/10/17 1830 (!) 150/91 - - (!) 106 - 99 % - -  12/10/17 1815 (!) 158/96 - - (!) 104 - 99 % - -  12/10/17 1800 (!) 167/81 - - 91 - - - -  12/10/17 1755 (!) 156/93 - - (!) 103 - - - -  12/10/17 1754 (!) 156/93 - - (!) 110 18 100 % - -  12/10/17 1731 (!) 152/95 98.5 F (36.9 C) Oral 97 18 100 % 5\' 2"  (1.575 m) 263 lb (119.3 kg)    Blood pressure (!) 142/77, pulse 93, temperature 98.5 F (36.9 C), temperature source Oral, resp. rate 18, height 5\' 2"  (1.575 m), weight 263 lb (119.3 kg), last menstrual period 03/29/2017, SpO2 99 %.  Physical Exam  Nursing note and vitals reviewed. Constitutional: She is oriented to person, place, and time. She appears well-developed and well-nourished.  HENT:  Head: Normocephalic.  Eyes: Pupils are equal, round, and reactive to light.  Neck: Normal range of motion.  Cardiovascular: Normal rate, regular rhythm and normal heart sounds.  Respiratory: Effort normal and breath sounds normal.  GI: Soft. Bowel sounds are normal.  Genitourinary:  Genitourinary Comments: deferred  Musculoskeletal: Normal range of motion.  Neurological: She is alert and oriented to person, place, and time.  Skin: Skin is warm and dry.  Psychiatric: She has a normal mood and affect. Her behavior is normal. Judgment and thought content normal.    MAU Course  Procedures  MDM CCUA CBC CMP P/C Ratio Offered BMZ 12 mg IM injection now, repeat in 24 hrs -- PT DECLINED after 3 attempts to explain the importance of receiving steroids for fetal lung maturity, patient states that she "doesn't take medicine like that". Patient requested to know what the side effects could be on her. Notified patient of the temporary spike in blood sugars from steroids; easily managed with diet and insulin dosing and rapid return to baseline.  Reassurance given that the benefits out weigh the risks of taking the steroid injection vs not taking the steroid injection. After Googling BMZ on her cell phone, she stated that she was still not going to consent to receiving the medication. Explained that it is her right to refuse the medication, but that I wuld clearly document that the risks and benefits were explained to her. Patient stated that she was "tired and just wanted to go home, because she has been here since 1400 and her blood sugar was dropping. She needed to "go home to get her kids in the bed and get something to eat."  NST - FHR: 130 bpm / moderate variability / accels present / decels absent / TOCO: UI noted   *Consult with Dr. Ihor Dow @ [phone] - notified of patient's complaints, assessments, lab & serial BPs results, tx plan d/c home, return to MAU on Saturday 1/5 for BP check and rpt PEC labs, recommend BMZ x 2 doses prior to d/c home // notified of patient declining BMZ  Results for orders placed or performed during the hospital encounter of 12/10/17 (from the past 24 hour(s))  CBC     Status: Abnormal   Collection Time: 12/10/17  5:31 PM  Result Value Ref Range   WBC 8.0 4.0 - 10.5 K/uL   RBC 4.57 3.87 - 5.11 MIL/uL   Hemoglobin 11.3 (L) 12.0 - 15.0 g/dL   HCT 34.1 (L) 36.0 - 46.0 %   MCV 74.6 (L) 78.0 - 100.0 fL   MCH 24.7 (L) 26.0 - 34.0 pg   MCHC 33.1 30.0 - 36.0 g/dL   RDW 15.5 11.5 - 15.5 %   Platelets 192 150 - 400 K/uL  Comprehensive metabolic panel     Status: Abnormal   Collection Time: 12/10/17  5:31 PM  Result Value Ref Range   Sodium 134 (L) 135 - 145 mmol/L   Potassium 3.6 3.5 - 5.1 mmol/L   Chloride 104 101 - 111 mmol/L   CO2 21 (L) 22 - 32 mmol/L   Glucose, Bld 100 (H) 65 - 99 mg/dL   BUN 8 6 - 20 mg/dL   Creatinine, Ser 0.63 0.44 - 1.00 mg/dL   Calcium 8.8 (L) 8.9 - 10.3 mg/dL   Total Protein 6.6 6.5 - 8.1 g/dL   Albumin 3.0 (L) 3.5 - 5.0 g/dL   AST 21 15 - 41 U/L   ALT 29 14 - 54 U/L    Alkaline Phosphatase 147 (H) 38 - 126 U/L   Total Bilirubin 0.3 0.3 - 1.2 mg/dL   GFR calc non Af Amer >60 >60 mL/min  GFR calc Af Amer >60 >60 mL/min   Anion gap 9 5 - 15  Protein / creatinine ratio, urine     Status: None   Collection Time: 12/10/17  5:40 PM  Result Value Ref Range   Creatinine, Urine 59.00 mg/dL   Total Protein, Urine 6 mg/dL   Protein Creatinine Ratio 0.10 0.00 - 0.15 mg/mg[Cre]     Assessment and Plan  Gestational hypertension, third trimester - Advised to return to MAU on Saturday 12/12/2017 for repeat PEC labs and BP recheck - 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   Laury Deep, MSN, CNM 12/10/2017, 6:32 PM

## 2017-12-11 ENCOUNTER — Inpatient Hospital Stay (HOSPITAL_COMMUNITY)
Admission: AD | Admit: 2017-12-11 | Discharge: 2017-12-11 | Disposition: A | Discharge status: Home / Self Care | Payer: Medicaid Other | Source: Ambulatory Visit | Attending: Obstetrics & Gynecology | Admitting: Obstetrics & Gynecology

## 2017-12-11 ENCOUNTER — Encounter (HOSPITAL_COMMUNITY): Payer: Self-pay | Admitting: *Deleted

## 2017-12-11 ENCOUNTER — Other Ambulatory Visit: Payer: Self-pay | Admitting: Obstetrics & Gynecology

## 2017-12-11 ENCOUNTER — Encounter (HOSPITAL_COMMUNITY): Payer: Self-pay

## 2017-12-11 DIAGNOSIS — B009 Herpesviral infection, unspecified: Secondary | ICD-10-CM | POA: Insufficient documentation

## 2017-12-11 DIAGNOSIS — O09523 Supervision of elderly multigravida, third trimester: Secondary | ICD-10-CM | POA: Diagnosis not present

## 2017-12-11 DIAGNOSIS — O09213 Supervision of pregnancy with history of pre-term labor, third trimester: Secondary | ICD-10-CM | POA: Insufficient documentation

## 2017-12-11 DIAGNOSIS — O26893 Other specified pregnancy related conditions, third trimester: Secondary | ICD-10-CM | POA: Diagnosis not present

## 2017-12-11 DIAGNOSIS — O98513 Other viral diseases complicating pregnancy, third trimester: Secondary | ICD-10-CM | POA: Diagnosis not present

## 2017-12-11 DIAGNOSIS — O24414 Gestational diabetes mellitus in pregnancy, insulin controlled: Secondary | ICD-10-CM | POA: Insufficient documentation

## 2017-12-11 DIAGNOSIS — O133 Gestational [pregnancy-induced] hypertension without significant proteinuria, third trimester: Secondary | ICD-10-CM | POA: Insufficient documentation

## 2017-12-11 DIAGNOSIS — D573 Sickle-cell trait: Secondary | ICD-10-CM | POA: Insufficient documentation

## 2017-12-11 DIAGNOSIS — Z8249 Family history of ischemic heart disease and other diseases of the circulatory system: Secondary | ICD-10-CM | POA: Insufficient documentation

## 2017-12-11 DIAGNOSIS — Z3A36 36 weeks gestation of pregnancy: Secondary | ICD-10-CM | POA: Insufficient documentation

## 2017-12-11 DIAGNOSIS — Z794 Long term (current) use of insulin: Secondary | ICD-10-CM | POA: Insufficient documentation

## 2017-12-11 DIAGNOSIS — O09219 Supervision of pregnancy with history of pre-term labor, unspecified trimester: Secondary | ICD-10-CM

## 2017-12-11 DIAGNOSIS — O99013 Anemia complicating pregnancy, third trimester: Secondary | ICD-10-CM | POA: Diagnosis not present

## 2017-12-11 DIAGNOSIS — R51 Headache: Secondary | ICD-10-CM | POA: Insufficient documentation

## 2017-12-11 DIAGNOSIS — Z87891 Personal history of nicotine dependence: Secondary | ICD-10-CM | POA: Diagnosis not present

## 2017-12-11 DIAGNOSIS — Z833 Family history of diabetes mellitus: Secondary | ICD-10-CM | POA: Insufficient documentation

## 2017-12-11 DIAGNOSIS — O09293 Supervision of pregnancy with other poor reproductive or obstetric history, third trimester: Secondary | ICD-10-CM | POA: Diagnosis not present

## 2017-12-11 DIAGNOSIS — I10 Essential (primary) hypertension: Secondary | ICD-10-CM | POA: Diagnosis present

## 2017-12-11 DIAGNOSIS — Z8551 Personal history of malignant neoplasm of bladder: Secondary | ICD-10-CM | POA: Diagnosis not present

## 2017-12-11 DIAGNOSIS — O09899 Supervision of other high risk pregnancies, unspecified trimester: Secondary | ICD-10-CM

## 2017-12-11 DIAGNOSIS — O09299 Supervision of pregnancy with other poor reproductive or obstetric history, unspecified trimester: Secondary | ICD-10-CM

## 2017-12-11 DIAGNOSIS — O0993 Supervision of high risk pregnancy, unspecified, third trimester: Secondary | ICD-10-CM

## 2017-12-11 LAB — CBC
HCT: 32.6 % — ABNORMAL LOW (ref 36.0–46.0)
Hemoglobin: 10.7 g/dL — ABNORMAL LOW (ref 12.0–15.0)
MCH: 24.5 pg — ABNORMAL LOW (ref 26.0–34.0)
MCHC: 32.8 g/dL (ref 30.0–36.0)
MCV: 74.8 fL — ABNORMAL LOW (ref 78.0–100.0)
Platelets: 181 10*3/uL (ref 150–400)
RBC: 4.36 MIL/uL (ref 3.87–5.11)
RDW: 15.5 % (ref 11.5–15.5)
WBC: 6.7 10*3/uL (ref 4.0–10.5)

## 2017-12-11 LAB — TYPE AND SCREEN
ABO/RH(D): B POS
Antibody Screen: POSITIVE
PT AG TYPE: NEGATIVE

## 2017-12-11 LAB — URINALYSIS, ROUTINE W REFLEX MICROSCOPIC
Bilirubin Urine: NEGATIVE
Glucose, UA: 150 mg/dL — AB
KETONES UR: NEGATIVE mg/dL
LEUKOCYTES UA: NEGATIVE
Nitrite: NEGATIVE
PROTEIN: NEGATIVE mg/dL
SPECIFIC GRAVITY, URINE: 1.015 (ref 1.005–1.030)
pH: 6 (ref 5.0–8.0)

## 2017-12-11 LAB — COMPREHENSIVE METABOLIC PANEL
ALT: 29 U/L (ref 14–54)
AST: 21 U/L (ref 15–41)
Albumin: 2.8 g/dL — ABNORMAL LOW (ref 3.5–5.0)
Alkaline Phosphatase: 140 U/L — ABNORMAL HIGH (ref 38–126)
Anion gap: 9 (ref 5–15)
BUN: 7 mg/dL (ref 6–20)
CO2: 22 mmol/L (ref 22–32)
CREATININE: 0.56 mg/dL (ref 0.44–1.00)
Calcium: 9.2 mg/dL (ref 8.9–10.3)
Chloride: 106 mmol/L (ref 101–111)
GFR calc Af Amer: >60 mL/min (ref >60)
GFR calc non Af Amer: >60 mL/min (ref >60)
Glucose, Bld: 120 mg/dL — ABNORMAL HIGH (ref 65–99)
POTASSIUM: 3.5 mmol/L (ref 3.5–5.1)
SODIUM: 137 mmol/L (ref 135–145)
Total Bilirubin: 0.4 mg/dL (ref 0.3–1.2)
Total Protein: 6.3 g/dL — ABNORMAL LOW (ref 6.5–8.1)

## 2017-12-11 LAB — PROTEIN / CREATININE RATIO, URINE
Creatinine, Urine: 96 mg/dL
Protein Creatinine Ratio: 0.13 mg/mg{Cre} (ref 0.00–0.15)
TOTAL PROTEIN, URINE: 12 mg/dL

## 2017-12-11 LAB — GC/CHLAMYDIA PROBE AMP (~~LOC~~) NOT AT ARMC
Chlamydia: NEGATIVE
NEISSERIA GONORRHEA: NEGATIVE

## 2017-12-11 NOTE — Patient Instructions (Signed)
Paula Bates  12/11/2017   Your procedure is scheduled on:  12/13/2017  Enter through the Maternity Admissions of [facility] at [place] AM.                Call this number if you have problems the morning of surgery:[phone]  Remember:   Do not eat food:After Midnight.  Do not drink clear liquids: After Midnight.  Take these medicines the morning of surgery with A SIP OF WATER: Take 10 units of NPH at bedtime.  Do not take any morning insulin.  If you have an episode of low blood sugar during the night treat it as needed.  Notify the nurses on admission what time, what the sugar was and how you treated it when you arrive.   Do not wear jewelry, make-up or nail polish.  Do not wear lotions, powders, or perfumes. Do not wear deodorant.  Do not shave 48 hours prior to surgery.  Do not bring valuables to the hospital.  [facility] is not   responsible for any belongings or valuables brought to the hospital.  Contacts, dentures or bridgework may not be worn into surgery.  Leave suitcase in the car. After surgery it may be brought to your room.  For patients admitted to the hospital, checkout time is 11:00 AM the day of              discharge.    N/A   Please read over the following fact sheets that you were given:   Surgical Site Infection Prevention

## 2017-12-11 NOTE — Discharge Instructions (Signed)
Preeclampsia and Eclampsia °Preeclampsia is a serious condition that develops only during pregnancy. It is also called toxemia of pregnancy. This condition causes high blood pressure along with other symptoms, such as swelling and headaches. These symptoms may develop as the condition gets worse. Preeclampsia may occur at 20 weeks of pregnancy or later. °Diagnosing and treating preeclampsia early is very important. If not treated early, it can cause serious problems for you and your baby. One problem it can lead to is eclampsia, which is a condition that causes muscle jerking or shaking (convulsions or seizures) in the mother. Delivering your baby is the best treatment for preeclampsia or eclampsia. Preeclampsia and eclampsia symptoms usually go away after your baby is born. °What are the causes? °The cause of preeclampsia is not known. °What increases the risk? °The following risk factors make you more likely to develop preeclampsia: °· Being pregnant for the first time. °· Having had preeclampsia during a past pregnancy. °· Having a family history of preeclampsia. °· Having high blood pressure. °· Being pregnant with twins or triplets. °· Being 35 or older. °· Being African-American. °· Having kidney disease or diabetes. °· Having medical conditions such as lupus or blood diseases. °· Being very overweight (obese). ° °What are the signs or symptoms? °The earliest signs of preeclampsia are: °· High blood pressure. °· Increased protein in your urine. Your health care provider will check for this at every visit before you give birth (prenatal visit). ° °Other symptoms that may develop as the condition gets worse include: °· Severe headaches. °· Sudden weight gain. °· Swelling of the hands, face, legs, and feet. °· Nausea and vomiting. °· Vision problems, such as blurred or double vision. °· Numbness in the face, arms, legs, and feet. °· Urinating less than usual. °· Dizziness. °· Slurred speech. °· Abdominal pain,  especially upper abdominal pain. °· Convulsions or seizures. ° °Symptoms generally go away after giving birth. °How is this diagnosed? °There are no screening tests for preeclampsia. Your health care provider will ask you about symptoms and check for signs of preeclampsia during your prenatal visits. You may also have tests that include: °· Urine tests. °· Blood tests. °· Checking your blood pressure. °· Monitoring your baby’s heart rate. °· Ultrasound. ° °How is this treated? °You and your health care provider will determine the treatment approach that is best for you. Treatment may include: °· Having more frequent prenatal exams to check for signs of preeclampsia, if you have an increased risk for preeclampsia. °· Bed rest. °· Reducing how much salt (sodium) you eat. °· Medicine to lower your blood pressure. °· Staying in the hospital, if your condition is severe. There, treatment will focus on controlling your blood pressure and the amount of fluids in your body (fluid retention). °· You may need to take medicine (magnesium sulfate) to prevent seizures. This medicine may be given as an injection or through an IV tube. °· Delivering your baby early, if your condition gets worse. You may have your labor started with medicine (induced), or you may have a cesarean delivery. ° °Follow these instructions at home: °Eating and drinking ° °· Drink enough fluid to keep your urine clear or pale yellow. °· Eat a healthy diet that is low in sodium. Do not add salt to your food. Check nutrition labels to see how much sodium a food or beverage contains. °· Avoid caffeine. °Lifestyle °· Do not use any products that contain nicotine or tobacco, such as cigarettes   and e-cigarettes. If you need help quitting, ask your health care provider. °· Do not use alcohol or drugs. °· Avoid stress as much as possible. Rest and get plenty of sleep. °General instructions °· Take over-the-counter and prescription medicines only as told by your  health care provider. °· When lying down, lie on your side. This keeps pressure off of your baby. °· When sitting or lying down, raise (elevate) your feet. Try putting some pillows underneath your lower legs. °· Exercise regularly. Ask your health care provider what kinds of exercise are best for you. °· Keep all follow-up and prenatal visits as told by your health care provider. This is important. °How is this prevented? °To prevent preeclampsia or eclampsia from developing during another pregnancy: °· Get proper medical care during pregnancy. Your health care provider may be able to prevent preeclampsia or diagnose and treat it early. °· Your health care provider may have you take a low-dose aspirin or a calcium supplement during your next pregnancy. °· You may have tests of your blood pressure and kidney function after giving birth. °· Maintain a healthy weight. Ask your health care provider for help managing weight gain during pregnancy. °· Work with your health care provider to manage any long-term (chronic) health conditions you have, such as diabetes or kidney problems. ° °Contact a health care provider if: °· You gain more weight than expected. °· You have headaches. °· You have nausea or vomiting. °· You have abdominal pain. °· You feel dizzy or light-headed. °Get help right away if: °· You develop sudden or severe swelling anywhere in your body. This usually happens in the legs. °· You gain 5 lbs (2.3 kg) or more during one week. °· You have severe: °? Abdominal pain. °? Headaches. °? Dizziness. °? Vision problems. °? Confusion. °? Nausea or vomiting. °· You have a seizure. °· You have trouble moving any part of your body. °· You develop numbness in any part of your body. °· You have trouble speaking. °· You have any abnormal bleeding. °· You pass out. °This information is not intended to replace advice given to you by your health care provider. Make sure you discuss any questions you have with your health  care provider. °Document Released: 11/21/2000 Document Revised: 07/22/2016 Document Reviewed: 06/30/2016 °Elsevier Interactive Patient Education © 2018 [employer]. ° °

## 2017-12-11 NOTE — MAU Provider Note (Signed)
History     CSN: [id number]  Arrival date and time: 12/11/17 1305   First Provider Initiated Contact with Patient 12/11/17 1348      Chief Complaint  Patient presents with  . Hypertension  . Headache   HPI Paula Bates is a 42 y.o. [id number] at [redacted]w[redacted]d who presents with elevated blood pressures and headache. She states a coworker checked her blood pressure and it was 170s/110s. She also reports a headache that is dull and ongoing since last night. She rates the pain a 4/10 and has not tried anything for the pain today. She denies any visual changes or epigastric pain. She reports good fetal movement. Denies any vaginal bleeding or leaking. Denies contractions.   OB History    Gravida Para Term Preterm AB Living   4 2 1 1 1 2    SAB TAB Ectopic Multiple Live Births   1       2      Past Medical History:  Diagnosis Date  . Bladder cancer ([place]) 12/2016  . Diabetes mellitus without complication (HCC)    gestational diabetes  . Herpes simplex infection    has never had outbreak, was tested in blood by family physician  . Hx of tonsillectomy    42 years old  . Infection    UTI  . Pregnancy induced hypertension   . Vaginal Pap smear, abnormal    cryotherapy, ok since    Past Surgical History:  Procedure Laterality Date  . BLADDER SURGERY  01/2016  . TONSILLECTOMY  1996    Family History  Problem Relation Age of Onset  . Diabetes Mother   . Hypertension Mother     Social History   Tobacco Use  . Smoking status: Former Smoker    Packs/day: 0.25    Types: Cigarettes    Last attempt to quit: 10/15/2010    Years since quitting: 7.1  . Smokeless tobacco: Never Used  Substance Use Topics  . Alcohol use: No    Alcohol/week: 0.0 oz    Frequency: Never    Comment: occ  . Drug use: No    Allergies: No Known Allergies  Medications Prior to Admission  Medication Sig Dispense Refill Last Dose  . insulin lispro (HUMALOG) 100 UNIT/ML injection Inject 24 Units into the  skin 3 (three) times daily with meals.    12/11/2017 at Unknown time  . insulin NPH Human (HUMULIN N,NOVOLIN N) 100 UNIT/ML injection Inject 0.18 mLs (18 Units total) into the skin daily before breakfast. 10 mL 11 12/11/2017 at Unknown time  . insulin NPH Human (HUMULIN N,NOVOLIN N) 100 UNIT/ML injection Inject 0.2 mLs (20 Units total) into the skin at bedtime. 10 mL 3 12/10/2017 at Unknown time  . Prenatal Multivit-Min-Fe-FA (PRENATAL VITAMINS PO) Take 1 tablet by mouth daily.    12/10/2017 at Unknown time  . valACYclovir (VALTREX) 500 MG tablet Take 1 tablet (500 mg total) by mouth 2 (two) times daily. (Patient not taking: Reported on 12/10/2017) 60 tablet 6 Not Taking    Review of Systems  Constitutional: Negative.  Negative for fatigue and fever.  HENT: Negative.   Eyes: Negative for visual disturbance.  Respiratory: Negative.  Negative for shortness of breath.   Cardiovascular: Negative.  Negative for chest pain.  Gastrointestinal: Negative.  Negative for abdominal pain, constipation, diarrhea, nausea and vomiting.  Genitourinary: Negative.  Negative for dysuria, vaginal bleeding and vaginal discharge.  Neurological: Positive for headaches. Negative for dizziness.   Physical Exam  Blood pressure 123/78, pulse 87, temperature 97.8 F (36.6 C), temperature source Oral, resp. rate 16, height 5\' 2"  (1.575 m), weight 266 lb (120.7 kg), last menstrual period 03/29/2017, SpO2 99 %.  Physical Exam  Nursing note and vitals reviewed. Constitutional: She is oriented to person, place, and time. She appears well-developed and well-nourished. No distress.  HENT:  Head: Normocephalic.  Eyes: Pupils are equal, round, and reactive to light.  Cardiovascular: Normal rate, regular rhythm and normal heart sounds.  Respiratory: Effort normal and breath sounds normal. No respiratory distress.  GI: Soft. Bowel sounds are normal. She exhibits no distension. There is no tenderness.  Neurological: She is alert and  oriented to person, place, and time. She has normal reflexes. She displays normal reflexes.  Skin: Skin is warm and dry.  Psychiatric: She has a normal mood and affect. Her behavior is normal. Judgment and thought content normal.   Fetal Tracing:  Baseline: 130 Variability: moderate Accels: 15x15 Decels: none  Toco: none  MAU Course  Procedures Results for orders placed or performed during the hospital encounter of 12/11/17 (from the past 24 hour(s))  Urinalysis, Routine w reflex microscopic     Status: Abnormal   Collection Time: 12/11/17  1:19 PM  Result Value Ref Range   Color, Urine YELLOW YELLOW   APPearance HAZY (A) CLEAR   Specific Gravity, Urine 1.015 1.005 - 1.030   pH 6.0 5.0 - 8.0   Glucose, UA 150 (A) NEGATIVE mg/dL   Hgb urine dipstick SMALL (A) NEGATIVE   Bilirubin Urine NEGATIVE NEGATIVE   Ketones, ur NEGATIVE NEGATIVE mg/dL   Protein, ur NEGATIVE NEGATIVE mg/dL   Nitrite NEGATIVE NEGATIVE   Leukocytes, UA NEGATIVE NEGATIVE   RBC / HPF 0-5 0 - 5 RBC/hpf   WBC, UA 0-5 0 - 5 WBC/hpf   Bacteria, UA RARE (A) NONE SEEN   Squamous Epithelial / LPF 0-5 (A) NONE SEEN   Mucus PRESENT   Protein / creatinine ratio, urine     Status: None   Collection Time: 12/11/17  1:19 PM  Result Value Ref Range   Creatinine, Urine 96.00 mg/dL   Total Protein, Urine 12 mg/dL   Protein Creatinine Ratio 0.13 0.00 - 0.15 mg/mg[Cre]  CBC     Status: Abnormal   Collection Time: 12/11/17  2:10 PM  Result Value Ref Range   WBC 6.7 4.0 - 10.5 K/uL   RBC 4.36 3.87 - 5.11 MIL/uL   Hemoglobin 10.7 (L) 12.0 - 15.0 g/dL   HCT 32.6 (L) 36.0 - 46.0 %   MCV 74.8 (L) 78.0 - 100.0 fL   MCH 24.5 (L) 26.0 - 34.0 pg   MCHC 32.8 30.0 - 36.0 g/dL   RDW 15.5 11.5 - 15.5 %   Platelets 181 150 - 400 K/uL  Comprehensive metabolic panel     Status: Abnormal   Collection Time: 12/11/17  2:10 PM  Result Value Ref Range   Sodium 137 135 - 145 mmol/L   Potassium 3.5 3.5 - 5.1 mmol/L   Chloride 106 101  - 111 mmol/L   CO2 22 22 - 32 mmol/L   Glucose, Bld 120 (H) 65 - 99 mg/dL   BUN 7 6 - 20 mg/dL   Creatinine, Ser 0.56 0.44 - 1.00 mg/dL   Calcium 9.2 8.9 - 10.3 mg/dL   Total Protein 6.3 (L) 6.5 - 8.1 g/dL   Albumin 2.8 (L) 3.5 - 5.0 g/dL   AST 21 15 - 41 U/L  ALT 29 14 - 54 U/L   Alkaline Phosphatase 140 (H) 38 - 126 U/L   Total Bilirubin 0.4 0.3 - 1.2 mg/dL   GFR calc non Af Amer >60 >60 mL/min   GFR calc Af Amer >60 >60 mL/min   Anion gap 9 5 - 15   MDM UA CBC, CMP, Protein/creat ratio Patient declined any medication for her headache. Reviewed results with Dr. Roselie Awkward- patient meets criteria for gestational hypertension. Will reschedule c/s for 37 weeks Assessment and Plan   1. Gestational hypertension, third trimester   2. Sickle cell trait ([place])   3. HSV infection   4. Supervision of high risk pregnancy, antepartum, third trimester   5. H/O pre-eclampsia in prior pregnancy, currently pregnant   6. H/O preterm delivery, currently pregnant    -Discharge home in stable condition -Preeclampsia precautions discussed -Patient advised to follow up for scheduled c-section on 1/6, to arrive at 0900. -Patient may return to MAU as needed or if her condition were to change or worsen  [place] 12/11/2017, 4:00 PM

## 2017-12-11 NOTE — MAU Note (Signed)
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

## 2017-12-12 LAB — RPR: RPR Ser Ql: NONREACTIVE

## 2017-12-13 ENCOUNTER — Other Ambulatory Visit: Payer: Self-pay

## 2017-12-13 ENCOUNTER — Inpatient Hospital Stay (HOSPITAL_COMMUNITY): Payer: Medicaid Other | Admitting: Anesthesiology

## 2017-12-13 ENCOUNTER — Encounter (HOSPITAL_COMMUNITY)
Admission: AD | Disposition: A | Discharge status: Home / Self Care | Payer: Self-pay | Source: Home / Self Care | Attending: Obstetrics & Gynecology

## 2017-12-13 ENCOUNTER — Inpatient Hospital Stay (HOSPITAL_COMMUNITY)
Admission: AD | Admit: 2017-12-13 | Discharge: 2017-12-15 | DRG: 787 | Disposition: A | Discharge status: Home / Self Care | Payer: Medicaid Other | Attending: Obstetrics & Gynecology | Admitting: Obstetrics & Gynecology

## 2017-12-13 ENCOUNTER — Encounter (HOSPITAL_COMMUNITY): Payer: Self-pay | Admitting: *Deleted

## 2017-12-13 DIAGNOSIS — O134 Gestational [pregnancy-induced] hypertension without significant proteinuria, complicating childbirth: Secondary | ICD-10-CM | POA: Diagnosis present

## 2017-12-13 DIAGNOSIS — O9832 Other infections with a predominantly sexual mode of transmission complicating childbirth: Secondary | ICD-10-CM | POA: Diagnosis present

## 2017-12-13 DIAGNOSIS — Z87891 Personal history of nicotine dependence: Secondary | ICD-10-CM

## 2017-12-13 DIAGNOSIS — O09899 Supervision of other high risk pregnancies, unspecified trimester: Secondary | ICD-10-CM

## 2017-12-13 DIAGNOSIS — I10 Essential (primary) hypertension: Secondary | ICD-10-CM

## 2017-12-13 DIAGNOSIS — O133 Gestational [pregnancy-induced] hypertension without significant proteinuria, third trimester: Secondary | ICD-10-CM

## 2017-12-13 DIAGNOSIS — O321XX0 Maternal care for breech presentation, not applicable or unspecified: Secondary | ICD-10-CM | POA: Diagnosis present

## 2017-12-13 DIAGNOSIS — D573 Sickle-cell trait: Secondary | ICD-10-CM | POA: Diagnosis present

## 2017-12-13 DIAGNOSIS — B009 Herpesviral infection, unspecified: Secondary | ICD-10-CM

## 2017-12-13 DIAGNOSIS — O3413 Maternal care for benign tumor of corpus uteri, third trimester: Secondary | ICD-10-CM | POA: Diagnosis present

## 2017-12-13 DIAGNOSIS — O99214 Obesity complicating childbirth: Secondary | ICD-10-CM | POA: Diagnosis present

## 2017-12-13 DIAGNOSIS — Z98891 History of uterine scar from previous surgery: Secondary | ICD-10-CM

## 2017-12-13 DIAGNOSIS — O2412 Pre-existing diabetes mellitus, type 2, in childbirth: Secondary | ICD-10-CM | POA: Diagnosis not present

## 2017-12-13 DIAGNOSIS — O24424 Gestational diabetes mellitus in childbirth, insulin controlled: Secondary | ICD-10-CM | POA: Diagnosis present

## 2017-12-13 DIAGNOSIS — D259 Leiomyoma of uterus, unspecified: Secondary | ICD-10-CM | POA: Diagnosis present

## 2017-12-13 DIAGNOSIS — O9902 Anemia complicating childbirth: Secondary | ICD-10-CM | POA: Diagnosis present

## 2017-12-13 DIAGNOSIS — Z3A37 37 weeks gestation of pregnancy: Secondary | ICD-10-CM | POA: Diagnosis not present

## 2017-12-13 DIAGNOSIS — O0993 Supervision of high risk pregnancy, unspecified, third trimester: Secondary | ICD-10-CM

## 2017-12-13 DIAGNOSIS — O09299 Supervision of pregnancy with other poor reproductive or obstetric history, unspecified trimester: Secondary | ICD-10-CM

## 2017-12-13 DIAGNOSIS — A6000 Herpesviral infection of urogenital system, unspecified: Secondary | ICD-10-CM | POA: Diagnosis present

## 2017-12-13 DIAGNOSIS — O24419 Gestational diabetes mellitus in pregnancy, unspecified control: Secondary | ICD-10-CM | POA: Diagnosis present

## 2017-12-13 DIAGNOSIS — O09219 Supervision of pregnancy with history of pre-term labor, unspecified trimester: Secondary | ICD-10-CM

## 2017-12-13 LAB — GLUCOSE, CAPILLARY
GLUCOSE-CAPILLARY: 77 mg/dL (ref 65–99)
GLUCOSE-CAPILLARY: 271 mg/dL — AB (ref 65–99)
Glucose-Capillary: 92 mg/dL (ref 65–99)
Glucose-Capillary: 94 mg/dL (ref 65–99)

## 2017-12-13 SURGERY — Surgical Case
Anesthesia: Spinal | Site: Abdomen | Wound class: Clean Contaminated

## 2017-12-13 MED ORDER — SIMETHICONE 80 MG PO CHEW: 80.0000 mg | CHEWABLE_TABLET | ORAL | Status: DC | PRN

## 2017-12-13 MED ORDER — PHENYLEPHRINE 8 MG IN D5W 100 ML (0.08MG/ML) PREMIX OPTIME
INJECTION | INTRAVENOUS | Status: DC | PRN
  Administered 2017-12-13: 60 ug/min via INTRAVENOUS

## 2017-12-13 MED ORDER — FENTANYL CITRATE (PF) 100 MCG/2ML IJ SOLN
INTRAMUSCULAR | Status: DC | PRN
  Administered 2017-12-13: 10 ug via INTRATHECAL

## 2017-12-13 MED ORDER — OXYTOCIN 10 UNIT/ML IJ SOLN
INTRAVENOUS | Status: DC | PRN
  Administered 2017-12-13: 40 [IU] via INTRAVENOUS

## 2017-12-13 MED ORDER — ACETAMINOPHEN 325 MG PO TABS
650.0000 mg | ORAL_TABLET | ORAL | Status: DC | PRN
  Administered 2017-12-14: 650 mg via ORAL
  Filled 2017-12-13: qty 2

## 2017-12-13 MED ORDER — NALBUPHINE HCL 10 MG/ML IJ SOLN: 5.0000 mg | INTRAMUSCULAR | Status: DC | PRN

## 2017-12-13 MED ORDER — KETOROLAC TROMETHAMINE 30 MG/ML IJ SOLN: 30.0000 mg | Freq: Four times a day (QID) | INTRAMUSCULAR | Status: AC

## 2017-12-13 MED ORDER — FENTANYL CITRATE (PF) 100 MCG/2ML IJ SOLN
INTRAMUSCULAR | Status: AC
  Filled 2017-12-13: qty 2

## 2017-12-13 MED ORDER — IBUPROFEN 600 MG PO TABS
600.0000 mg | ORAL_TABLET | Freq: Four times a day (QID) | ORAL | Status: DC
  Administered 2017-12-13 – 2017-12-15 (×8): 600 mg via ORAL
  Filled 2017-12-13 (×8): qty 1

## 2017-12-13 MED ORDER — SENNOSIDES-DOCUSATE SODIUM 8.6-50 MG PO TABS
2.0000 | ORAL_TABLET | ORAL | Status: DC
  Administered 2017-12-14 (×2): 2 via ORAL
  Filled 2017-12-13 (×2): qty 2

## 2017-12-13 MED ORDER — ACETAMINOPHEN 500 MG PO TABS
1000.0000 mg | ORAL_TABLET | Freq: Four times a day (QID) | ORAL | Status: AC
  Administered 2017-12-13 – 2017-12-14 (×4): 1000 mg via ORAL
  Filled 2017-12-13 (×4): qty 2

## 2017-12-13 MED ORDER — LACTATED RINGERS IV SOLN
INTRAVENOUS | Status: DC
  Administered 2017-12-13: 19:00:00 via INTRAVENOUS

## 2017-12-13 MED ORDER — DEXAMETHASONE SODIUM PHOSPHATE 10 MG/ML IJ SOLN
INTRAMUSCULAR | Status: AC
  Filled 2017-12-13: qty 1

## 2017-12-13 MED ORDER — METHYLERGONOVINE MALEATE 0.2 MG PO TABS: 0.2000 mg | ORAL_TABLET | ORAL | Status: DC | PRN

## 2017-12-13 MED ORDER — MORPHINE SULFATE (PF) 0.5 MG/ML IJ SOLN
INTRAMUSCULAR | Status: AC
  Filled 2017-12-13: qty 10

## 2017-12-13 MED ORDER — ONDANSETRON HCL 4 MG/2ML IJ SOLN
INTRAMUSCULAR | Status: AC
  Filled 2017-12-13: qty 2

## 2017-12-13 MED ORDER — CEFAZOLIN SODIUM-DEXTROSE 2-4 GM/100ML-% IV SOLN
2.0000 g | INTRAVENOUS | Status: AC
  Administered 2017-12-13: 2 g via INTRAVENOUS
  Filled 2017-12-13: qty 100

## 2017-12-13 MED ORDER — LACTATED RINGERS IV SOLN: INTRAVENOUS | Status: DC

## 2017-12-13 MED ORDER — ONDANSETRON HCL 4 MG/2ML IJ SOLN: 4.0000 mg | Freq: Three times a day (TID) | INTRAMUSCULAR | Status: DC | PRN

## 2017-12-13 MED ORDER — SCOPOLAMINE 1 MG/3DAYS TD PT72
1.0000 | MEDICATED_PATCH | Freq: Once | TRANSDERMAL | Status: DC
  Administered 2017-12-13: 1.5 mg via TRANSDERMAL
  Filled 2017-12-13: qty 1

## 2017-12-13 MED ORDER — DIPHENHYDRAMINE HCL 50 MG/ML IJ SOLN: 12.5000 mg | INTRAMUSCULAR | Status: DC | PRN

## 2017-12-13 MED ORDER — DIBUCAINE 1 % RE OINT: 1.0000 "application " | TOPICAL_OINTMENT | RECTAL | Status: DC | PRN

## 2017-12-13 MED ORDER — KETOROLAC TROMETHAMINE 30 MG/ML IJ SOLN: 30.0000 mg | Freq: Four times a day (QID) | INTRAMUSCULAR | Status: AC | PRN

## 2017-12-13 MED ORDER — DIPHENHYDRAMINE HCL 25 MG PO CAPS: 25.0000 mg | ORAL_CAPSULE | Freq: Four times a day (QID) | ORAL | Status: DC | PRN

## 2017-12-13 MED ORDER — SOD CITRATE-CITRIC ACID 500-334 MG/5ML PO SOLN
30.0000 mL | Freq: Once | ORAL | Status: AC
  Administered 2017-12-13: 30 mL via ORAL
  Filled 2017-12-13: qty 15

## 2017-12-13 MED ORDER — LACTATED RINGERS IV SOLN
INTRAVENOUS | Status: DC | PRN
  Administered 2017-12-13: 12:00:00 via INTRAVENOUS

## 2017-12-13 MED ORDER — FENTANYL CITRATE (PF) 100 MCG/2ML IJ SOLN: 25.0000 ug | INTRAMUSCULAR | Status: DC | PRN

## 2017-12-13 MED ORDER — DEXAMETHASONE SODIUM PHOSPHATE 10 MG/ML IJ SOLN
INTRAMUSCULAR | Status: DC | PRN
  Administered 2017-12-13: 10 mg via INTRAVENOUS

## 2017-12-13 MED ORDER — MENTHOL 3 MG MT LOZG: 1.0000 | LOZENGE | OROMUCOSAL | Status: DC | PRN

## 2017-12-13 MED ORDER — NALOXONE HCL 0.4 MG/ML IJ SOLN: 0.4000 mg | INTRAMUSCULAR | Status: DC | PRN

## 2017-12-13 MED ORDER — MEPERIDINE HCL 25 MG/ML IJ SOLN: 6.2500 mg | INTRAMUSCULAR | Status: DC | PRN

## 2017-12-13 MED ORDER — NALOXONE HCL 0.4 MG/ML IJ SOLN: 1.0000 ug/kg/h | INTRAVENOUS | Status: DC | PRN

## 2017-12-13 MED ORDER — WITCH HAZEL-GLYCERIN EX PADS
1.0000 | MEDICATED_PAD | CUTANEOUS | Status: DC | PRN
Start: 2017-12-13 — End: 2017-12-15

## 2017-12-13 MED ORDER — OXYCODONE-ACETAMINOPHEN 5-325 MG PO TABS
1.0000 | ORAL_TABLET | ORAL | Status: DC | PRN
  Administered 2017-12-15 (×2): 1 via ORAL
  Filled 2017-12-13 (×2): qty 1

## 2017-12-13 MED ORDER — OXYTOCIN 40 UNITS IN LACTATED RINGERS INFUSION - SIMPLE MED: 2.5000 [IU]/h | INTRAVENOUS | Status: AC

## 2017-12-13 MED ORDER — MIDAZOLAM HCL 2 MG/2ML IJ SOLN
INTRAMUSCULAR | Status: DC | PRN
  Administered 2017-12-13: 2 mg via INTRAVENOUS

## 2017-12-13 MED ORDER — PRENATAL MULTIVITAMIN CH
1.0000 | ORAL_TABLET | Freq: Every day | ORAL | Status: DC
  Administered 2017-12-14 – 2017-12-15 (×2): 1 via ORAL
  Filled 2017-12-13 (×2): qty 1

## 2017-12-13 MED ORDER — LACTATED RINGERS IV SOLN
INTRAVENOUS | Status: DC
  Administered 2017-12-13 (×2): via INTRAVENOUS

## 2017-12-13 MED ORDER — SIMETHICONE 80 MG PO CHEW
80.0000 mg | CHEWABLE_TABLET | Freq: Three times a day (TID) | ORAL | Status: DC
  Administered 2017-12-14 – 2017-12-15 (×4): 80 mg via ORAL
  Filled 2017-12-13 (×4): qty 1

## 2017-12-13 MED ORDER — KETOROLAC TROMETHAMINE 30 MG/ML IJ SOLN
INTRAMUSCULAR | Status: AC
  Filled 2017-12-13: qty 1

## 2017-12-13 MED ORDER — PHENYLEPHRINE 8 MG IN D5W 100 ML (0.08MG/ML) PREMIX OPTIME
INJECTION | INTRAVENOUS | Status: AC
  Filled 2017-12-13: qty 100

## 2017-12-13 MED ORDER — SODIUM CHLORIDE 0.9% FLUSH: 3.0000 mL | INTRAVENOUS | Status: DC | PRN

## 2017-12-13 MED ORDER — SIMETHICONE 80 MG PO CHEW
80.0000 mg | CHEWABLE_TABLET | ORAL | Status: DC
  Administered 2017-12-14 (×2): 80 mg via ORAL
  Filled 2017-12-13 (×2): qty 1

## 2017-12-13 MED ORDER — METHYLERGONOVINE MALEATE 0.2 MG/ML IJ SOLN: 0.2000 mg | INTRAMUSCULAR | Status: DC | PRN

## 2017-12-13 MED ORDER — COCONUT OIL OIL: 1.0000 "application " | TOPICAL_OIL | Status: DC | PRN

## 2017-12-13 MED ORDER — ZOLPIDEM TARTRATE 5 MG PO TABS
5.0000 mg | ORAL_TABLET | Freq: Every evening | ORAL | Status: DC | PRN
Start: 2017-12-13 — End: 2017-12-15

## 2017-12-13 MED ORDER — NALBUPHINE HCL 10 MG/ML IJ SOLN: 5.0000 mg | Freq: Once | INTRAMUSCULAR | Status: DC | PRN

## 2017-12-13 MED ORDER — METOCLOPRAMIDE HCL 5 MG/ML IJ SOLN: 10.0000 mg | Freq: Once | INTRAMUSCULAR | Status: DC | PRN

## 2017-12-13 MED ORDER — MORPHINE SULFATE (PF) 0.5 MG/ML IJ SOLN
INTRAMUSCULAR | Status: DC | PRN
  Administered 2017-12-13: .2 mg via INTRATHECAL

## 2017-12-13 MED ORDER — OXYCODONE-ACETAMINOPHEN 5-325 MG PO TABS: 2.0000 | ORAL_TABLET | ORAL | Status: DC | PRN

## 2017-12-13 MED ORDER — OXYTOCIN 10 UNIT/ML IJ SOLN
INTRAMUSCULAR | Status: AC
  Filled 2017-12-13: qty 4

## 2017-12-13 MED ORDER — ONDANSETRON HCL 4 MG/2ML IJ SOLN
INTRAMUSCULAR | Status: DC | PRN
  Administered 2017-12-13: 4 mg via INTRAVENOUS

## 2017-12-13 MED ORDER — DIPHENHYDRAMINE HCL 25 MG PO CAPS: 25.0000 mg | ORAL_CAPSULE | ORAL | Status: DC | PRN

## 2017-12-13 MED ORDER — TETANUS-DIPHTH-ACELL PERTUSSIS 5-2.5-18.5 LF-MCG/0.5 IM SUSP: 0.5000 mL | Freq: Once | INTRAMUSCULAR | Status: DC

## 2017-12-13 MED ORDER — MIDAZOLAM HCL 2 MG/2ML IJ SOLN
INTRAMUSCULAR | Status: AC
  Filled 2017-12-13: qty 2

## 2017-12-13 SURGICAL SUPPLY — 39 items
BENZOIN TINCTURE PRP APPL 2/3 (GAUZE/BANDAGES/DRESSINGS) IMPLANT
CHLORAPREP W/TINT 26ML (MISCELLANEOUS) ×6 IMPLANT
CLAMP CORD UMBIL (MISCELLANEOUS) IMPLANT
CLOSURE WOUND 1/2 X4 (GAUZE/BANDAGES/DRESSINGS)
CLOTH BEACON ORANGE TIMEOUT ST (SAFETY) ×3 IMPLANT
DERMABOND ADVANCED (GAUZE/BANDAGES/DRESSINGS) ×2
DERMABOND ADVANCED .7 DNX12 (GAUZE/BANDAGES/DRESSINGS) ×1 IMPLANT
DRSG OPSITE POSTOP 4X10 (GAUZE/BANDAGES/DRESSINGS) ×3 IMPLANT
ELECT REM PT RETURN 9FT ADLT (ELECTROSURGICAL) ×3
ELECTRODE REM PT RTRN 9FT ADLT (ELECTROSURGICAL) ×1 IMPLANT
EXTRACTOR VACUUM KIWI (MISCELLANEOUS) IMPLANT
GLOVE BIO SURGEON ST LM GN SZ9 (GLOVE) IMPLANT
GLOVE BIOGEL PI IND STRL 7.0 (GLOVE) ×3 IMPLANT
GLOVE BIOGEL PI IND STRL 9 (GLOVE) IMPLANT
GLOVE BIOGEL PI INDICATOR 7.0 (GLOVE) ×6
GLOVE BIOGEL PI INDICATOR 9 (GLOVE)
GLOVE ECLIPSE 8.0 STRL XLNG CF (GLOVE) ×3 IMPLANT
GLOVE INDICATOR 8.0 STRL GRN (GLOVE) ×3 IMPLANT
GOWN STRL REUS W/TWL 2XL LVL3 (GOWN DISPOSABLE) ×6 IMPLANT
GOWN STRL REUS W/TWL LRG LVL3 (GOWN DISPOSABLE) ×3 IMPLANT
HOVERMATT SINGLE USE (MISCELLANEOUS) ×3 IMPLANT
NEEDLE HYPO 25X5/8 SAFETYGLIDE (NEEDLE) ×3 IMPLANT
NS IRRIG 1000ML POUR BTL (IV SOLUTION) ×3 IMPLANT
PACK C SECTION WH (CUSTOM PROCEDURE TRAY) ×3 IMPLANT
PAD OB MATERNITY 4.3X12.25 (PERSONAL CARE ITEMS) ×3 IMPLANT
PENCIL SMOKE EVAC W/HOLSTER (ELECTROSURGICAL) ×3 IMPLANT
RTRCTR C-SECT PINK 25CM LRG (MISCELLANEOUS) ×3 IMPLANT
RTRCTR C-SECT PINK 34CM XLRG (MISCELLANEOUS) IMPLANT
STRIP CLOSURE SKIN 1/2X4 (GAUZE/BANDAGES/DRESSINGS) IMPLANT
SUT MNCRL 0 VIOLET CTX 36 (SUTURE) ×4 IMPLANT
SUT MONOCRYL 0 CTX 36 (SUTURE) ×8
SUT VIC AB 0 CT1 27 (SUTURE) ×2
SUT VIC AB 0 CT1 27XBRD ANBCTR (SUTURE) ×1 IMPLANT
SUT VIC AB 2-0 CT1 27 (SUTURE) ×2
SUT VIC AB 2-0 CT1 TAPERPNT 27 (SUTURE) ×1 IMPLANT
SUT VIC AB 4-0 KS 27 (SUTURE) ×3 IMPLANT
SYR BULB IRRIGATION 50ML (SYRINGE) IMPLANT
TOWEL OR 17X24 6PK STRL BLUE (TOWEL DISPOSABLE) ×3 IMPLANT
TRAY FOLEY BAG SILVER LF 14FR (SET/KITS/TRAYS/PACK) ×3 IMPLANT

## 2017-12-13 NOTE — H&P (Signed)
Paula Bates is a 42 y.o. female presenting for primary Caesarean section at [redacted]w[redacted]d Estimated Date of Delivery: 01/03/18 Pt was scheduled for delivery by Dr Roselie Awkward due to gestational hypertension and the  Fetus is in the breech presentation. She has an anterior leiomyoma which will make it impossible for ECV attempt.  Pt is also a Class A2 DM on insulin NPH 18 before breakfast and supper and novolog 18 units with each meal and BS have been well controlled on that regimen. OB History    Gravida Para Term Preterm AB Living   4 2 1 1 1 2    SAB TAB Ectopic Multiple Live Births   1       2     Past Medical History:  Diagnosis Date  . Bladder cancer ([place]) 12/2016  . Diabetes mellitus without complication (HCC)    gestational diabetes  . Herpes simplex infection    has never had outbreak, was tested in blood by family physician  . Hx of tonsillectomy    42 years old  . Infection    UTI  . Pregnancy induced hypertension   . Vaginal Pap smear, abnormal    cryotherapy, ok since   Past Surgical History:  Procedure Laterality Date  . BLADDER SURGERY  01/2016  . TONSILLECTOMY  1996   Family History: family history includes Diabetes in her mother; Hypertension in her mother. Social History:  reports that she quit smoking about 7 years ago. Her smoking use included cigarettes. She smoked 0.25 packs per day. she has never used smokeless tobacco. She reports that she does not drink alcohol or use drugs.     Maternal Diabetes: Yes:  Diabetes Type:  Insulin/Medication controlled Genetic Screening: Declined Maternal Ultrasounds/Referrals: Normal Fetal Ultrasounds or other Referrals:  None Maternal Substance Abuse:  No Significant Maternal Medications:  insulin Significant Maternal Lab Results:  None Other Comments:  None  ROS   Review of Systems  Constitutional: Negative for fever, chills, weight loss, malaise/fatigue and diaphoresis.  HENT: Negative for hearing loss, ear pain,  nosebleeds, congestion, sore throat, neck pain, tinnitus and ear discharge.   Eyes: Negative for blurred vision, double vision, photophobia, pain, discharge and redness.  Respiratory: Negative for cough, hemoptysis, sputum production, shortness of breath, wheezing and stridor.   Cardiovascular: Negative for chest pain, palpitations, orthopnea, claudication, leg swelling and PND.  Gastrointestinal: negative for abdominal pain. Negative for heartburn, nausea, vomiting, diarrhea, constipation, blood in stool and melena.  Genitourinary: Negative for dysuria, urgency, frequency, hematuria and flank pain.  Musculoskeletal: Negative for myalgias, back pain, joint pain and falls.  Skin: Negative for itching and rash.  Neurological: Negative for dizziness, tingling, tremors, sensory change, speech change, focal weakness, seizures, loss of consciousness, weakness and headaches.  Endo/Heme/Allergies: Negative for environmental allergies and polydipsia. Does not bruise/bleed easily.  Psychiatric/Behavioral: Negative for depression, suicidal ideas, hallucinations, memory loss and substance abuse. The patient is not nervous/anxious and does not have insomnia.      History  Past Medical History:  Diagnosis Date  . Bladder cancer ([place]) 12/2016  . Diabetes mellitus without complication (HCC)    gestational diabetes  . Herpes simplex infection    has never had outbreak, was tested in blood by family physician  . Hx of tonsillectomy    42 years old  . Infection    UTI  . Pregnancy induced hypertension   . Vaginal Pap smear, abnormal    cryotherapy, ok since    Past Surgical History:  Procedure Laterality Date  . BLADDER SURGERY  01/2016  . TONSILLECTOMY  1996    OB History    Gravida Para Term Preterm AB Living   4 2 1 1 1 2    SAB TAB Ectopic Multiple Live Births   1       2      No Known Allergies  Social History   Socioeconomic History  . Marital status: Divorced    Spouse name: Not  on file  . Number of children: Not on file  . Years of education: Not on file  . Highest education level: Not on file  Social Needs  . Financial resource strain: Not on file  . Food insecurity - worry: Not on file  . Food insecurity - inability: Not on file  . Transportation needs - medical: Not on file  . Transportation needs - non-medical: Not on file  Occupational History  . Not on file  Tobacco Use  . Smoking status: Former Smoker    Packs/day: 0.25    Types: Cigarettes    Last attempt to quit: 10/15/2010    Years since quitting: 7.1  . Smokeless tobacco: Never Used  Substance and Sexual Activity  . Alcohol use: No    Alcohol/week: 0.0 oz    Frequency: Never    Comment: occ  . Drug use: No  . Sexual activity: Yes    Birth control/protection: None  Other Topics Concern  . Not on file  Social History Narrative  . Not on file    Family History  Problem Relation Age of Onset  . Diabetes Mother   . Hypertension Mother        Blood pressure (!) 143/94, pulse 97, temperature 98.5 F (36.9 C), temperature source Oral, resp. rate 18, height 5\' 2"  (1.575 m), weight 261 lb 9.6 oz (118.7 kg), last menstrual period 03/29/2017, SpO2 100 %. Exam Physical Exam  Physical Exam  Vitals reviewed. Constitutional: She is oriented to person, place, and time. She appears well-developed and well-nourished.  HENT:  Head: Normocephalic and atraumatic.  Right Ear: External ear normal.  Left Ear: External ear normal.  Nose: Nose normal.  Mouth/Throat: Oropharynx is clear and moist.  Eyes: Conjunctivae and EOM are normal. Pupils are equal, round, and reactive to light. Right eye exhibits no discharge. Left eye exhibits no discharge. No scleral icterus.  Neck: Normal range of motion. Neck supple. No tracheal deviation present. No thyromegaly present.  Cardiovascular: Normal rate, regular rhythm, normal heart sounds and intact distal pulses.  Exam reveals no gallop and no friction rub.    No murmur heard. Respiratory: Effort normal and breath sounds normal. No respiratory distress. She has no wheezes. She has no rales. She exhibits no tenderness.  GI: Soft. Bowel sounds are normal. She exhibits no distension and no mass. There is tenderness. There is no rebound and no guarding.  Genitourinary:       Vulva is normal without lesions Vagina is pink moist without discharge Cervix normal in appearance and pap is normal Uterus is size consistent with 37 weeks Adnexa is negative with normal sized ovaries by sonogram  Musculoskeletal: Normal range of motion. She exhibits no edema and no tenderness.  Neurological: She is alert and oriented to person, place, and time. She has normal reflexes. She displays normal reflexes. No cranial nerve deficit. She exhibits normal muscle tone. Coordination normal.  Skin: Skin is warm and dry. No rash noted. No erythema. No pallor.  Psychiatric: She has  a normal mood and affect. Her behavior is normal. Judgment and thought content normal.    Prenatal labs: ABO, Rh: --/--/B POS (01/04 1627) Antibody: POS (01/04 1627) Rubella: Immune (07/23 0000) RPR: Non Reactive (01/04 1627)  HBsAg: Negative (07/23 0000)  HIV:    GBS:     Assessment/Plan: [redacted]w[redacted]d Estimated Date of Delivery: 01/03/18  Class A2 DM Gestational Hypertension Breech presentation with large anterior myoma as probable cause  Pt is scheduled for primary Caesarean section, declines tubal ligation   Florian Buff 12/13/2017, 11:21 AM

## 2017-12-13 NOTE — Progress Notes (Signed)
Paula Bates CNM was informed of blood pressures of 151/67 and 141/65.  New parameters give to call if blood pressure is greater than 160/90.  RN will continue to monitor.

## 2017-12-13 NOTE — Progress Notes (Signed)
Dr Marcell Barlow called and given report on pt. OK to discharge pt to floor.

## 2017-12-13 NOTE — Op Note (Signed)
Preoperative diagnosis:  1.  Intrauterine pregnancy at [redacted]w[redacted]d  weeks gestation                                         2.  Gestational hypertension                                         3.  Breech presentation                                         4.  Class A2 DM   Postoperative diagnosis:  Same as above   Procedure:  Primary, low transverse, cesarean section  Surgeon:  Florian Buff MD  Assistant:    Anesthesia: Spinal  Findings:  .    Over a low transverse incision was delivered a viable female with Apgars of 8 and 9 weighing pending lbs.  oz. The baby was in the frank breech presentation.  Uterus was significant for a large anterior myoma, the tubes and ovaries were all normal.  There were no other significant findings  Description of operation:  Patient was taken to the operating room and placed in the sitting position where she underwent a spinal anesthetic. She was then placed in the supine position with tilt to the left side. When adequate anesthetic level was obtained she was prepped and draped in usual sterile fashion and a Foley catheter was placed. A Pfannenstiel skin incision was made and carried down sharply to the rectus fascia which was scored in the midline extended laterally. The fascia was taken off the muscles both superiorly and without difficulty. The muscles were divided.  The peritoneal cavity was entered.  Bladder blade was placed, no bladder flap was created.  A low transverse hysterotomy incision was made and delivered a viable female  infant at 26 with Apgars of 8 and 9 weighingpending lbs  oz.  Cord pH was obtained and was pending. The uterus was exteriorized. It was closed in 2 layers, the first being a running interlocking layer and the second being an imbricating layer using 0 monocryl on a CTX needle. There was good resulting hemostasis. The uterus tubes and ovaries were all normal. Peritoneal cavity was irrigated vigorously. The muscles and peritoneum were  reapproximated loosely. The fascia was closed using 0 Vicryl in running fashion. Subcutaneous tissue was made hemostatic and irrigated. The skin was closed using 4-0 Vicryl on a Keith needle in a subcuticular fashion.  Dermabond was placed for additional wound integrity and to serve as a barrier. Blood loss for the procedure was 923 cc. The patient received 2 gram of Ancef prophylactically. The patient was taken to the recovery room in good stable condition with all counts being correct x3.  EBL 923 cc  Florian Buff 12/13/2017 12:59 PM

## 2017-12-13 NOTE — Anesthesia Preprocedure Evaluation (Signed)
Anesthesia Evaluation  Patient identified by MRN, date of birth, ID band Patient awake    Reviewed: Allergy & Precautions, NPO status , Patient's Chart, lab work & pertinent test results  Airway Mallampati: III  TM Distance: >3 FB Neck ROM: Full    Dental no notable dental hx.    Pulmonary former smoker,    Pulmonary exam normal breath sounds clear to auscultation       Cardiovascular hypertension, Normal cardiovascular exam Rhythm:Regular Rate:Normal     Neuro/Psych negative neurological ROS  negative psych ROS   GI/Hepatic negative GI ROS, Neg liver ROS,   Endo/Other  diabetes, GestationalMorbid obesity  Renal/GU negative Renal ROS  negative genitourinary   Musculoskeletal negative musculoskeletal ROS (+)   Abdominal   Peds negative pediatric ROS (+)  Hematology negative hematology ROS (+)   Anesthesia Other Findings   Reproductive/Obstetrics negative OB ROS (+) Pregnancy                             Anesthesia Physical Anesthesia Plan  ASA: III  Anesthesia Plan: Spinal   Post-op Pain Management:    Induction:   PONV Risk Score and Plan: 2 and Ondansetron, Treatment may vary due to age or medical condition and Scopolamine patch - Pre-op  Airway Management Planned: Natural Airway  Additional Equipment:   Intra-op Plan:   Post-operative Plan:   Informed Consent: I have reviewed the patients History and Physical, chart, labs and discussed the procedure including the risks, benefits and alternatives for the proposed anesthesia with the patient or authorized representative who has indicated his/her understanding and acceptance.   Dental advisory given  Plan Discussed with:   Anesthesia Plan Comments:         Anesthesia Quick Evaluation

## 2017-12-13 NOTE — Progress Notes (Signed)
MD paged for pt's high postprandial blood sugar of 271. Pt states that she had three cranberry juice and a tray of fruits prior to blood sugar being taken. Pt asked to order a snack and was instructed to order low carb snack. MD made aware.

## 2017-12-13 NOTE — Transfer of Care (Signed)
Immediate Anesthesia Transfer of Care Note  Patient: Paula Bates  Procedure(s) Performed: CESAREAN SECTION (N/A Abdomen)  Patient Location: PACU  Anesthesia Type:Spinal  Level of Consciousness: awake, alert  and oriented  Airway & Oxygen Therapy: Patient Spontanous Breathing  Post-op Assessment: Report given to RN and Post -op Vital signs reviewed and stable  Post vital signs: Reviewed and stable  Last Vitals:  Vitals:   12/13/17 0933 12/13/17 0944  BP: (!) 138/103 (!) 143/94  Pulse: (!) 109 97  Resp: 18   Temp: 36.9 C   SpO2: 100%     Last Pain:  Vitals:   12/13/17 0933  TempSrc: Oral  PainSc: 0-No pain         Complications: No apparent anesthesia complications

## 2017-12-14 ENCOUNTER — Telehealth: Payer: Self-pay | Admitting: General Practice

## 2017-12-14 ENCOUNTER — Encounter (HOSPITAL_COMMUNITY): Admit: 2017-12-14 | Discharge: 2017-12-14 | Disposition: A | Discharge status: Home / Self Care | Payer: Medicaid Other

## 2017-12-14 LAB — GLUCOSE, CAPILLARY
GLUCOSE-CAPILLARY: 85 mg/dL (ref 65–99)
Glucose-Capillary: 86 mg/dL (ref 65–99)
Glucose-Capillary: 154 mg/dL — ABNORMAL HIGH (ref 65–99)
Glucose-Capillary: 185 mg/dL — ABNORMAL HIGH (ref 65–99)
Glucose-Capillary: 255 mg/dL — ABNORMAL HIGH (ref 65–99)
Glucose-Capillary: 155 mg/dL — ABNORMAL HIGH (ref 65–99)

## 2017-12-14 LAB — CBC
HEMATOCRIT: 28.3 % — AB (ref 36.0–46.0)
Hemoglobin: 9.2 g/dL — ABNORMAL LOW (ref 12.0–15.0)
MCH: 24.2 pg — ABNORMAL LOW (ref 26.0–34.0)
MCHC: 32.5 g/dL (ref 30.0–36.0)
MCV: 74.5 fL — AB (ref 78.0–100.0)
Platelets: 187 10*3/uL (ref 150–400)
RBC: 3.8 MIL/uL — ABNORMAL LOW (ref 3.87–5.11)
RDW: 15.6 % — AB (ref 11.5–15.5)
WBC: 13.3 10*3/uL — AB (ref 4.0–10.5)

## 2017-12-14 LAB — CULTURE, BETA STREP (GROUP B ONLY): STREP GP B CULTURE: NEGATIVE

## 2017-12-14 LAB — GLUCOSE, RANDOM: Glucose, Bld: 189 mg/dL — ABNORMAL HIGH (ref 65–99)

## 2017-12-14 MED ORDER — INSULIN NPH (HUMAN) (ISOPHANE) 100 UNIT/ML ~~LOC~~ SUSP
10.0000 [IU] | Freq: Once | SUBCUTANEOUS | Status: AC
  Administered 2017-12-14: 10 [IU] via SUBCUTANEOUS
  Filled 2017-12-14: qty 10

## 2017-12-14 MED ORDER — INSULIN NPH (HUMAN) (ISOPHANE) 100 UNIT/ML ~~LOC~~ SUSP
15.0000 [IU] | Freq: Two times a day (BID) | SUBCUTANEOUS | Status: DC
  Administered 2017-12-14 – 2017-12-15 (×2): 15 [IU] via SUBCUTANEOUS

## 2017-12-14 MED ORDER — INSULIN ASPART 100 UNIT/ML ~~LOC~~ SOLN
15.0000 [IU] | Freq: Three times a day (TID) | SUBCUTANEOUS | Status: DC
  Administered 2017-12-15: 15 [IU] via SUBCUTANEOUS
  Filled 2017-12-14: qty 1

## 2017-12-14 NOTE — Telephone Encounter (Signed)
Patient called and left message on nurse line stating she wants to talk to a nurse about her high blood pressure. Per chart review, patient went to MAU on 1/4 & delivered via c section yesterday 1/6.

## 2017-12-14 NOTE — Anesthesia Postprocedure Evaluation (Signed)
Anesthesia Post Note  Patient: Paula Bates  Procedure(s) Performed: CESAREAN SECTION (N/A Abdomen)     Patient location during evaluation: Mother Baby Anesthesia Type: Spinal Level of consciousness: awake and alert Pain management: pain level controlled Vital Signs Assessment: post-procedure vital signs reviewed and stable Respiratory status: spontaneous breathing, nonlabored ventilation and respiratory function stable Cardiovascular status: stable Postop Assessment: no headache, no backache, spinal receding, patient able to bend at knees, adequate PO intake and no apparent nausea or vomiting Anesthetic complications: no    Last Vitals:  Vitals:   12/14/17 0556 12/14/17 0930  BP: (!) 111/58 (!) 120/59  Pulse: 79 75  Resp: 18 18  Temp: 36.8 C 36.6 C  SpO2: 95% 99%    Last Pain:  Vitals:   12/14/17 0930  TempSrc: Oral  PainSc: 0-No pain   Pain Goal:                 [employer]

## 2017-12-14 NOTE — Progress Notes (Signed)
Subjective: Postpartum Day 1: Cesarean Delivery Patient reports tolerating PO and no problems voiding.  No concerns. Pain is well controlled.   Objective: Vital signs in last 24 hours: Temp:  [97.7 F (36.5 C)-99 F (37.2 C)] 98.3 F (36.8 C) (01/07 0556) Pulse Rate:  [69-109] 79 (01/07 0556) Resp:  [16-31] 18 (01/07 0556) BP: (111-151)/(58-103) 111/58 (01/07 0556) SpO2:  [95 %-100 %] 95 % (01/07 0556) Weight:  [118.7 kg (261 lb 9.6 oz)] 118.7 kg (261 lb 9.6 oz) (01/06 0933)  Physical Exam:  General: alert, cooperative and no distress Lochia: appropriate Uterine Fundus: firm Incision: no significant drainage DVT Evaluation: No evidence of DVT seen on physical exam.  Recent Labs    12/11/17 1410 12/14/17 0551  HGB 10.7* 9.2*  HCT 32.6* 28.3*    Assessment/Plan: Status post Cesarean section. Doing well postoperatively.  Continue current care Trend CBGs; may need treatment Will provide birth control options to patient Plan for discharge tomorrow if doing well  Luiz Blare, DO 12/14/2017, 9:02 AM

## 2017-12-14 NOTE — Plan of Care (Signed)
Patient progressing appropriately at this time.

## 2017-12-14 NOTE — Progress Notes (Signed)
Inpatient Diabetes Program Recommendations  AACE/ADA: New Consensus Statement on Inpatient Glycemic Control (2015)  Target Ranges:  Prepandial:   less than 140 mg/dL      Peak postprandial:   less than 180 mg/dL (1-2 hours)      Critically ill patients:  140 - 180 mg/dL   Results for LOA, IDLER (MRN [id number]) as of 12/14/2017 07:43  Ref. Range 12/13/2017 09:55 12/13/2017 11:07 12/13/2017 15:26 12/13/2017 21:14 12/14/2017 05:05  Glucose-Capillary Latest Ref Range: 65 - 99 mg/dL 92 77 94 271 (H) 154 (H)   Review of Glycemic Control  Diabetes history: GDM Outpatient Diabetes medications: NPH 18 units QAM, NPH 20 units QHS, Humalog 24 units TID  Current orders for Inpatient glycemic control: None  Inpatient Diabetes Program Recommendations: Correction (SSI): While inpatient, please consider ordering CBGs with Novolog 0-9 units TID with meals and Novolog 0-5 units QHS. Outpatient Follow Up: Recommend patient follow up with PCP regarding glycemic control.  NOTE: In reviewing chart, noted patient has GDM and patient had c-section on 12/13/16. Patient received Decadron 10 mg @ 11:51 am on 12/13/17 which is likely cause of hyperglycemia post delivery. Would recommend ordering CBGs with Novolog correction scale ACHS while inpatient and have patient follow up with PCP regarding glycemic control.  Thanks, Barnie Alderman, RN, MSN, CDE Diabetes Coordinator Inpatient Diabetes Program [phone] (Team Pager from 8am to 5pm)

## 2017-12-15 DIAGNOSIS — I10 Essential (primary) hypertension: Secondary | ICD-10-CM

## 2017-12-15 LAB — GLUCOSE, CAPILLARY
Glucose-Capillary: 86 mg/dL (ref 65–99)
Glucose-Capillary: 80 mg/dL (ref 65–99)

## 2017-12-15 MED ORDER — OXYCODONE-ACETAMINOPHEN 5-325 MG PO TABS: 1.0000 | ORAL_TABLET | Freq: Four times a day (QID) | ORAL | 0 refills | Status: DC | PRN

## 2017-12-15 MED ORDER — IBUPROFEN 600 MG PO TABS: 600.0000 mg | ORAL_TABLET | Freq: Four times a day (QID) | ORAL | 0 refills | Status: DC

## 2017-12-15 MED ORDER — METFORMIN HCL 500 MG PO TABS: 500.0000 mg | ORAL_TABLET | Freq: Two times a day (BID) | ORAL | 11 refills | Status: AC

## 2017-12-15 MED ORDER — AMLODIPINE BESYLATE 5 MG PO TABS: 5.0000 mg | ORAL_TABLET | Freq: Every day | ORAL | 11 refills | Status: DC

## 2017-12-15 NOTE — Discharge Summary (Signed)
OB Discharge Summary     Patient Name: Paula Bates DOB: [birth year] MRN: [id number] Date of admission: 12/13/2017  Delivering MD: Tania Ade H   Date of discharge: 12/15/2017  Admitting diagnosis: Status post Cesarean section for breech presentation complicated by uterine leiomyoma Intrauterine pregnancy: [redacted]w[redacted]d    Secondary diagnosis:  Active Problems:   Patient Active Problem List   Diagnosis Date Noted  . Hypertension 12/15/2017  . S/P cesarean section 12/13/2017  . Breech presentation, no version   . Gestational hypertension 12/10/2017  . Vitamin D deficiency 10/19/2017  . Sickle cell trait ([place]) 10/13/2017  . 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 Gestational diabetes 10/12/2017  . Supervision of high risk pregnancy, antepartum, third trimester 10/12/2017  . H/O pre-eclampsia in prior pregnancy, currently pregnant 10/12/2017  . H/O preterm delivery, currently pregnant 10/12/2017  . Herpes simplex infection   . Uterine leiomyoma 07/13/2017  . 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 Obesity, unspecified 03/29/2014    Additional problems:  - Hypertension - Persistently elevated blood glucose     Discharge diagnosis: Term Pregnancy Delivered                                                                                                Post partum procedures:none  Augmentation: none  Complications: None  Hospital course:  Sceduled C/S   42 y.o. yo [phone] at [redacted]w[redacted]d was admitted to the hospital 12/13/2017 for scheduled cesarean section with the following indication:Malpresentation complicated by uterine leiomyoma Membrane Rupture Time/Date: 12:09 PM ,12/13/2017   Patient delivered a Viable infant.12/13/2017   Blood pressure remained persistently elevated after delivery, discharged home with amlodipine 5mg  daily. Blood sugar remained persistently elevated after delivery, discharged home with instructions to begin metformin 500mg  BID, titrating up to  1,000mg  BID as tolerated.   Details of operation can be found in separate operative note.  Pateint had an uncomplicated postpartum course.  She is ambulating, tolerating a regular diet, passing flatus, and urinating well. Patient is discharged home in stable condition on  12/15/17         Physical exam  Vitals:   12/14/17 1807 12/15/17 0542  BP: 137/70 (!) 144/65  Pulse: 94 74  Resp: 18   Temp: 98.6 F (37 C) 98.2 F (36.8 C)  SpO2: 100%     General: alert, cooperative and no distress Lochia: appropriate Uterine Fundus: firm Incision: Healing well with no significant drainage, No significant erythema, Dressing is clean, dry, and intact DVT Evaluation: No evidence of DVT seen on physical exam.  Labs: Results for orders placed or performed during the hospital encounter of 12/13/17 (from the past 24 hour(s))  Glucose, capillary     Status: Abnormal   Collection Time: 12/14/17 11:33 AM  Result Value Ref Range   Glucose-Capillary 185 (H) 65 - 99 mg/dL  Glucose, capillary     Status: Abnormal   Collection Time: 12/14/17  3:05 PM  Result Value Ref Range   Glucose-Capillary 255 (H) 65 - 99 mg/dL  Glucose, random     Status: Abnormal   Collection Time: 12/14/17  5:58 PM  Result Value Ref Range   Glucose, Bld 189 (H) 65 - 99 mg/dL  Glucose, capillary     Status: Abnormal   Collection Time: 12/14/17  9:33 PM  Result Value Ref Range   Glucose-Capillary 155 (H) 65 - 99 mg/dL  Glucose, capillary     Status: None   Collection Time: 12/15/17  5:36 AM  Result Value Ref Range   Glucose-Capillary 86 65 - 99 mg/dL   CBC    Component Value Date/Time   WBC 13.3 (H) 12/14/2017 0551   RBC 3.80 (L) 12/14/2017 0551   HGB 9.2 (L) 12/14/2017 0551   HGB 11.4 06/29/2017   HCT 28.3 (L) 12/14/2017 0551   HCT 36 06/29/2017   PLT 187 12/14/2017 0551   MCV 74.5 (L) 12/14/2017 0551   MCH 24.2 (L) 12/14/2017 0551   MCHC 32.5 12/14/2017 0551   RDW 15.6 (H) 12/14/2017 0551   LYMPHSABS 1.0  01/01/2015 2347   MONOABS 0.3 01/01/2015 2347   EOSABS 0.0 01/01/2015 2347   BASOSABS 0.0 01/01/2015 2347    Discharge instruction: per After Visit Summary and "Baby and Me Booklet".  After visit meds:  No Known Allergies  Allergies as of 12/15/2017   No Known Allergies     Medication List    STOP taking these medications   acetaminophen 325 MG tablet Commonly known as:  TYLENOL   insulin lispro 100 UNIT/ML injection Commonly known as:  HUMALOG   insulin NPH Human 100 UNIT/ML injection Commonly known as:  HUMULIN N,NOVOLIN N     TAKE these medications   amLODipine 5 MG tablet Commonly known as:  NORVASC Take 1 tablet (5 mg total) by mouth daily.   ibuprofen 600 MG tablet Commonly known as:  ADVIL,MOTRIN Take 1 tablet (600 mg total) by mouth every 6 (six) hours.   metFORMIN 500 MG tablet Commonly known as:  GLUCOPHAGE Take 1 tablet (500 mg total) by mouth 2 (two) times daily with a meal.   oxyCODONE-acetaminophen 5-325 MG tablet Commonly known as:  PERCOCET/ROXICET Take 1 tablet by mouth every 6 (six) hours as needed (pain scale 4-7).   prenatal multivitamin Tabs tablet Take 1 tablet by mouth daily at 12 noon.   valACYclovir 500 MG tablet Commonly known as:  VALTREX Take 1 tablet (500 mg total) by mouth 2 (two) times daily.        Diet: routine diet  Activity: Advance as tolerated. Pelvic rest for 6 weeks.   Outpatient follow up:2 weeks BP and incision check; and 4-wk PPV Future Appointments:  Future Appointments  Date Time Provider [place]  12/28/2017 11:00 AM [place]  01/25/2018 10:55 AM Sloan Leiter, MD [place] [facility]    Postpartum contraception: None  Newborn Data: APGAR (1 MIN): 8   APGAR (5 MINS): 9   weight 7 lb 10.8 oz (3481 g)  Baby Feeding: Bottle Disposition:home with mother  Gailen Shelter, MD  12/15/2017

## 2017-12-15 NOTE — Discharge Instructions (Signed)
Cesarean Delivery, Care After Refer to this sheet in the next few weeks. These instructions provide you with information about caring for yourself after your procedure. Your health care provider may also give you more specific instructions. Your treatment has been planned according to current medical practices, but problems sometimes occur. Call your health care provider if you have any problems or questions after your procedure. What can I expect after the procedure? After the procedure, it is common to have:  A small amount of blood or clear fluid coming from the incision.  Some redness, swelling, and pain in your incision area.  Some abdominal pain and soreness.  Vaginal bleeding (lochia).  Pelvic cramps.  Fatigue.  Follow these instructions at home: Incision care   Follow instructions from your health care provider about how to take care of your incision. Make sure you: ? Wash your hands with soap and water before you change your bandage (dressing). If soap and water are not available, use hand sanitizer. ? If you have a dressing, change it as told by your health care provider. ? Leave stitches (sutures), skin staples, skin glue, or adhesive strips in place. These skin closures may need to stay in place for 2 weeks or longer. If adhesive strip edges start to loosen and curl up, you may trim the loose edges. Do not remove adhesive strips completely unless your health care provider tells you to do that.  Check your incision area every day for signs of infection. Check for: ? More redness, swelling, or pain. ? More fluid or blood. ? Warmth. ? Pus or a bad smell.  When you cough or sneeze, hug a pillow. This helps with pain and decreases the chance of your incision opening up (dehiscing). Do this until your incision heals. Medicines  Take over-the-counter and prescription medicines only as told by your health care provider.  If you were prescribed an antibiotic medicine, take it  as told by your health care provider. Do not stop taking the antibiotic until it is finished. Driving  Do not drive or operate heavy machinery while taking prescription pain medicine. Lifestyle  Do not drink alcohol. This is especially important if you are breastfeeding or taking pain medicine.  Do not use tobacco products, including cigarettes, chewing tobacco, or e-cigarettes. If you need help quitting, ask your health care provider. Tobacco can delay wound healing. Eating and drinking  Drink at least 8 eight-ounce glasses of water every day unless told not to by your health care provider. If you breastfeed, you may need to drink more water than this.  Eat high-fiber foods every day. These foods may help prevent or relieve constipation. High-fiber foods include: ? Whole grain cereals and breads. ? Brown rice. ? Beans. ? Fresh fruits and vegetables. Activity  Return to your normal activities as told by your health care provider. Ask your health care provider what activities are safe for you.  Rest as much as possible. Try to rest or take a nap while your baby is sleeping.  Do not lift anything that is heavier than your baby or 10 lb (4.5 kg) as told by your health care provider.  Ask your health care provider when you can engage in sexual activity. This may depend on your: ? Risk of infection. ? Healing rate. ? Comfort and desire to engage in sexual activity. Bathing  Do not take baths, swim, or use a hot tub until your health care provider approves. Ask your health care provider if  you can take showers. You may only be allowed to take sponge baths until your incision heals. General instructions  Do not use tampons or douches until your health care provider approves.  Wear: ? Loose, comfortable clothing. ? A supportive and well-fitting bra.  Watch for any blood clots that may pass from your vagina. These may look like clumps of dark red, brown, or black discharge.  Keep  your perineum clean and dry as told by your health care provider.  Wipe from front to back when you use the toilet.  If possible, have someone help you care for your baby and help with household activities for a few days after you leave the hospital.  Keep all follow-up visits for you and your baby as told by your health care provider. This is important. Contact a health care provider if:  You have: ? Bad-smelling vaginal discharge. ? Difficulty urinating. ? Pain when urinating. ? A sudden increase or decrease in the frequency of your bowel movements. ? More redness, swelling, or pain around your incision. ? More fluid or blood coming from your incision. ? Pus or a bad smell coming from your incision. ? A fever. ? A rash. ? Little or no interest in activities you used to enjoy. ? Questions about caring for yourself or your baby. ? Nausea.  Your incision feels warm to the touch.  Your breasts turn red or become painful or hard.  You feel unusually sad or worried.  You vomit.  You pass large blood clots from your vagina. If you pass a blood clot, save it to show to your health care provider. Do not flush blood clots down the toilet without showing your health care provider.  You urinate more than usual.  You are dizzy or light-headed.  You have not breastfed and have not had a menstrual period for 12 weeks after delivery.  You stopped breastfeeding and have not had a menstrual period for 12 weeks after stopping breastfeeding. Get help right away if:  You have: ? Pain that does not go away or get better with medicine. ? Chest pain. ? Difficulty breathing. ? Blurred vision or spots in your vision. ? Thoughts about hurting yourself or your baby. ? New pain in your abdomen or in one of your legs. ? A severe headache.  You faint.  You bleed from your vagina so much that you fill two sanitary pads in one hour. This information is not intended to replace advice given to  you by your health care provider. Make sure you discuss any questions you have with your health care provider. Document Released: 08/16/2002 Document Revised: 12/27/2016 Document Reviewed: 10/29/2015 Elsevier Interactive Patient Education  2018 [employer].   Diabetes Mellitus and Nutrition When you have diabetes (diabetes mellitus), it is very important to have healthy eating habits because your blood sugar (glucose) levels are greatly affected by what you eat and drink. Eating healthy foods in the appropriate amounts, at about the same times every day, can help you:  Control your blood glucose.  Lower your risk of heart disease.  Improve your blood pressure.  Reach or maintain a healthy weight.  Every person with diabetes is different, and each person has different needs for a meal plan. Your health care provider may recommend that you work with a diet and nutrition specialist (dietitian) to make a meal plan that is best for you. Your meal plan may vary depending on factors such as:  The calories you need.  The medicines you take.  Your weight.  Your blood glucose, blood pressure, and cholesterol levels.  Your activity level.  Other health conditions you have, such as heart or kidney disease.  How do carbohydrates affect me? Carbohydrates affect your blood glucose level more than any other type of food. Eating carbohydrates naturally increases the amount of glucose in your blood. Carbohydrate counting is a method for keeping track of how many carbohydrates you eat. Counting carbohydrates is important to keep your blood glucose at a healthy level, especially if you use insulin or take certain oral diabetes medicines. It is important to know how many carbohydrates you can safely have in each meal. This is different for every person. Your dietitian can help you calculate how many carbohydrates you should have at each meal and for snack. Foods that contain carbohydrates  include:  Bread, cereal, rice, pasta, and crackers.  Potatoes and corn.  Peas, beans, and lentils.  Milk and yogurt.  Fruit and juice.  Desserts, such as cakes, cookies, ice cream, and candy.  How does alcohol affect me? Alcohol can cause a sudden decrease in blood glucose (hypoglycemia), especially if you use insulin or take certain oral diabetes medicines. Hypoglycemia can be a life-threatening condition. Symptoms of hypoglycemia (sleepiness, dizziness, and confusion) are similar to symptoms of having too much alcohol. If your health care provider says that alcohol is safe for you, follow these guidelines:  Limit alcohol intake to no more than 1 drink per day for nonpregnant women and 2 drinks per day for men. One drink equals 12 oz of beer, 5 oz of wine, or 1 oz of hard liquor.  Do not drink on an empty stomach.  Keep yourself hydrated with water, diet soda, or unsweetened iced tea.  Keep in mind that regular soda, juice, and other mixers may contain a lot of sugar and must be counted as carbohydrates.  What are tips for following this plan? Reading food labels  Start by checking the serving size on the label. The amount of calories, carbohydrates, fats, and other nutrients listed on the label are based on one serving of the food. Many foods contain more than one serving per package.  Check the total grams (g) of carbohydrates in one serving. You can calculate the number of servings of carbohydrates in one serving by dividing the total carbohydrates by 15. For example, if a food has 30 g of total carbohydrates, it would be equal to 2 servings of carbohydrates.  Check the number of grams (g) of saturated and trans fats in one serving. Choose foods that have low or no amount of these fats.  Check the number of milligrams (mg) of sodium in one serving. Most people should limit total sodium intake to less than 2,300 mg per day.  Always check the nutrition information of foods  labeled as "low-fat" or "nonfat". These foods may be higher in added sugar or refined carbohydrates and should be avoided.  Talk to your dietitian to identify your daily goals for nutrients listed on the label. Shopping  Avoid buying canned, premade, or processed foods. These foods tend to be high in fat, sodium, and added sugar.  Shop around the outside edge of the grocery store. This includes fresh fruits and vegetables, bulk grains, fresh meats, and fresh dairy. Cooking  Use low-heat cooking methods, such as baking, instead of high-heat cooking methods like deep frying.  Cook using healthy oils, such as olive, canola, or sunflower oil.  Avoid cooking with  butter, cream, or high-fat meats. Meal planning  Eat meals and snacks regularly, preferably at the same times every day. Avoid going long periods of time without eating.  Eat foods high in fiber, such as fresh fruits, vegetables, beans, and whole grains. Talk to your dietitian about how many servings of carbohydrates you can eat at each meal.  Eat 4-6 ounces of lean protein each day, such as lean meat, chicken, fish, eggs, or tofu. 1 ounce is equal to 1 ounce of meat, chicken, or fish, 1 egg, or 1/4 cup of tofu.  Eat some foods each day that contain healthy fats, such as avocado, nuts, seeds, and fish. Lifestyle   Check your blood glucose regularly.  Exercise at least 30 minutes 5 or more days each week, or as told by your health care provider.  Take medicines as told by your health care provider.  Do not use any products that contain nicotine or tobacco, such as cigarettes and e-cigarettes. If you need help quitting, ask your health care provider.  Work with a Social worker or diabetes educator to identify strategies to manage stress and any emotional and social challenges. What are some questions to ask my health care provider?  Do I need to meet with a diabetes educator?  Do I need to meet with a dietitian?  What number  can I call if I have questions?  When are the best times to check my blood glucose? Where to find more information:  American Diabetes Association: diabetes.org/food-and-fitness/food  Academy of Nutrition and Dietetics: PokerClues.dk  [employer] of Diabetes and Digestive and Kidney Diseases (NIH): ContactWire.be Summary  A healthy meal plan will help you control your blood glucose and maintain a healthy lifestyle.  Working with a diet and nutrition specialist (dietitian) can help you make a meal plan that is best for you.  Keep in mind that carbohydrates and alcohol have immediate effects on your blood glucose levels. It is important to count carbohydrates and to use alcohol carefully. This information is not intended to replace advice given to you by your health care provider. Make sure you discuss any questions you have with your health care provider. Document Released: 08/21/2005 Document Revised: 12/29/2016 Document Reviewed: 12/29/2016 Elsevier Interactive Patient Education  [employer].

## 2017-12-15 NOTE — Addendum Note (Signed)
Addendum  created 12/15/17 1113 by Montez Hageman, MD   [place]

## 2017-12-17 ENCOUNTER — Encounter: Payer: Medicaid Other | Admitting: Obstetrics and Gynecology

## 2017-12-17 ENCOUNTER — Other Ambulatory Visit: Payer: Medicaid Other

## 2017-12-18 ENCOUNTER — Telehealth: Payer: Self-pay | Admitting: General Practice

## 2017-12-18 NOTE — Telephone Encounter (Signed)
Patient called and left message on nurse line stating she needs to have FMLA paperwork filled out and needs our fax number. Called patient, no answer- left message stating we are trying to reach you to return your phone call, we have received your message. Our fax number is [phone]. You will also need to come by our office during office hours to fill out something for [place] as well before we can complete your paperwork, if you have questions you may call us back and speak with our front office staff.

## 2017-12-21 ENCOUNTER — Encounter: Payer: Self-pay | Admitting: Obstetrics and Gynecology

## 2017-12-21 ENCOUNTER — Inpatient Hospital Stay (HOSPITAL_COMMUNITY): Payer: Medicaid Other

## 2017-12-21 ENCOUNTER — Encounter (HOSPITAL_COMMUNITY): Payer: Self-pay

## 2017-12-21 ENCOUNTER — Inpatient Hospital Stay (HOSPITAL_COMMUNITY)
Admission: AD | Admit: 2017-12-21 | Discharge: 2017-12-21 | Disposition: A | Discharge status: Home / Self Care | Payer: Medicaid Other | Source: Ambulatory Visit | Attending: Obstetrics and Gynecology | Admitting: Obstetrics and Gynecology

## 2017-12-21 ENCOUNTER — Ambulatory Visit (INDEPENDENT_AMBULATORY_CARE_PROVIDER_SITE_OTHER): Payer: Self-pay | Admitting: General Practice

## 2017-12-21 VITALS — BP 136/79 | HR 98 | Ht 62.0 in | Wt 241.0 lb

## 2017-12-21 DIAGNOSIS — O9089 Other complications of the puerperium, not elsewhere classified: Secondary | ICD-10-CM | POA: Diagnosis present

## 2017-12-21 DIAGNOSIS — O902 Hematoma of obstetric wound: Secondary | ICD-10-CM | POA: Diagnosis not present

## 2017-12-21 DIAGNOSIS — Z87891 Personal history of nicotine dependence: Secondary | ICD-10-CM | POA: Insufficient documentation

## 2017-12-21 DIAGNOSIS — Z5189 Encounter for other specified aftercare: Secondary | ICD-10-CM

## 2017-12-21 DIAGNOSIS — Z029 Encounter for administrative examinations, unspecified: Secondary | ICD-10-CM

## 2017-12-21 DIAGNOSIS — T8189XA Other complications of procedures, not elsewhere classified, initial encounter: Secondary | ICD-10-CM

## 2017-12-21 LAB — CBC
HCT: 34.1 % — ABNORMAL LOW (ref 36.0–46.0)
Hemoglobin: 11 g/dL — ABNORMAL LOW (ref 12.0–15.0)
MCH: 24 pg — AB (ref 26.0–34.0)
MCHC: 32.3 g/dL (ref 30.0–36.0)
MCV: 74.3 fL — AB (ref 78.0–100.0)
PLATELETS: 338 10*3/uL (ref 150–400)
RBC: 4.59 MIL/uL (ref 3.87–5.11)
RDW: 15.5 % (ref 11.5–15.5)
WBC: 7 10*3/uL (ref 4.0–10.5)

## 2017-12-21 MED ORDER — IOPAMIDOL (ISOVUE-300) INJECTION 61%
100.0000 mL | Freq: Once | INTRAVENOUS | Status: AC | PRN
  Administered 2017-12-21: 100 mL via INTRAVENOUS

## 2017-12-21 MED ORDER — CEPHALEXIN 500 MG PO CAPS: 500.0000 mg | ORAL_CAPSULE | Freq: Four times a day (QID) | ORAL | 2 refills | Status: DC

## 2017-12-21 MED ORDER — IOPAMIDOL (ISOVUE-300) INJECTION 61%
30.0000 mL | INTRAVENOUS | Status: AC
  Administered 2017-12-21: 30 mL via ORAL

## 2017-12-21 NOTE — Discharge Instructions (Signed)
How to Minimize Scarring After Surgery  Scarring is a risk of any surgery that involves cutting the skin (an incision). However, every person scars differently. Factors that affect how you scar include:   Which surgery technique was used.   Where the incision was made on your body.   Your overall health.   Your age.   Your skin.    You can reduce scarring by following instructions from your health care provider for care at home after surgery.This includes keeping your incision clean, moist, and protected from the sun.  How to minimize scarring after surgery  Right After Surgery    Follow instructions from your health care provider about how to take care of your incision. Make sure you:   Wash your hands with soap and water before you change your bandage (dressing). If soap and water are not available, use hand sanitizer.   Change your dressing one time each dayor as told by your health care provider.   Keep your incision clean by gently washing it with soap and water as told by your health care provider. This will help to prevent infection.   If directed, apply antibiotic ointment or petroleum jelly to the incision to keep it moist until it heals fully. You may need to moisten two times per day for about 2 weeks.   Leave stitches (sutures), skin glue, or adhesive strips in place. These skin closures may need to be in place for 2 weeks or longer. If adhesive strip edges start to loosen and curl up, you may trim the loose edges. Do not remove adhesive strips completely unless your health care provider tells you to do that.   Avoid touching or manipulating your incision unless needed. Wash your hands thoroughly before and after you touch your incision.   Get sutures taken out at the scheduled time.   Follow all restrictions, such as limits on exercise or work. What you should do and should not do will depend on where your incision is located.    After Your Skin Has Healed   Keep your scar protected from  the sun. Cover the scar with sunscreen that has an SPF (sun protection factor) of 30 or higher. Do not put sunscreen on your scar until it has healed.   Gently massage the scar using a circular motion. This will help to minimize the appearance of the scar. Do this only after the incision has closed and all of the sutures have been removed.   Remember that the scar may appear lighter or darker than your normal skin color. This difference in color should even out with time.   If your scar does not fade or go away with time and you do not like how it looks, consider talking with a plastic surgeon or a dermatologist.   Keep all follow-up visits as told by your health care provider. This is important.  Contact a health care provider if:   Your sutures come out before your health care provider said they would.   You have more redness, swelling, or pain around your incision.   You have more fluid or blood coming from your incision.   Your incision feels warm to the touch.   You have pus or a bad smell coming from your incision.   You have a fever.   You think that you are having a reaction to the antibiotic ointment.  Get help right away if:   You have bleeding from the incision that   does not stop.  This information is not intended to replace advice given to you by your health care provider. Make sure you discuss any questions you have with your health care provider.  Document Released: 05/14/2010 Document Revised: 05/01/2016 Document Reviewed: 06/27/2015  Elsevier Interactive Patient Education  2018 [employer] Inc[employer]

## 2017-12-21 NOTE — Progress Notes (Signed)
Patient here for wound check today. C-section 8 days ago. Patient states she is concerned her incision popped open. Honeycomb dressing was in place but loose. Removed dressing- incision is healing well but leaking a large amount of serosanguinous fluid.   Dr Ilda Basset viewed incision & recommended patient go to [facility] for evaluation/CT scan. [facility] called & patient escorted upstairs.

## 2017-12-21 NOTE — MAU Provider Note (Signed)
Chief Complaint: incision draining   None     SUBJECTIVE HPI: Paula Bates is a 42 y.o. [phone] on POD#8 following pLTCS for breech presentation who presents to maternity admissions sent from the office by Dr Ilda Basset for C/S incision drainage.  She reports she was doing well after her C/S then today started feeling fluid leakage. She comes to MAU wearing gauze, a pad, and a towel for the amount of drainage.  She has not tried any treatments other than covering it with a dressing.  There is no pain. There are no other associated symptoms.  Her lochia is light, she denies fever/chills, n/v.       HPI  Past Medical History:  Diagnosis Date  . Bladder cancer ([place]) 12/2016  . Diabetes mellitus without complication (HCC)    gestational diabetes  . Herpes simplex infection    has never had outbreak, was tested in blood by family physician  . Hx of tonsillectomy    42 years old  . Infection    UTI  . Pregnancy induced hypertension   . Vaginal Pap smear, abnormal    cryotherapy, ok since   Past Surgical History:  Procedure Laterality Date  . BLADDER SURGERY  01/2016  . CESAREAN SECTION N/A 12/13/2017   Procedure: CESAREAN SECTION;  Surgeon: Florian Buff, MD;  Location: [facility];  Service: Obstetrics;  Laterality: N/A;  . TONSILLECTOMY  1996   Social History   Socioeconomic History  . Marital status: Divorced    Spouse name: Not on file  . Number of children: Not on file  . Years of education: Not on file  . Highest education level: Not on file  Social Needs  . Financial resource strain: Not on file  . Food insecurity - worry: Not on file  . Food insecurity - inability: Not on file  . Transportation needs - medical: Not on file  . Transportation needs - non-medical: Not on file  Occupational History  . Not on file  Tobacco Use  . Smoking status: Former Smoker    Packs/day: 0.25    Types: Cigarettes    Last attempt to quit: 10/15/2010    Years since quitting:  7.1  . Smokeless tobacco: Never Used  Substance and Sexual Activity  . Alcohol use: No    Alcohol/week: 0.0 oz    Frequency: Never    Comment: occ  . Drug use: No  . Sexual activity: Not Currently    Birth control/protection: None  Other Topics Concern  . Not on file  Social History Narrative  . Not on file   No current facility-administered medications on file prior to encounter.    Current Outpatient Medications on File Prior to Encounter  Medication Sig Dispense Refill  . amLODipine (NORVASC) 5 MG tablet Take 1 tablet (5 mg total) by mouth daily. 30 tablet 11  . ibuprofen (ADVIL,MOTRIN) 600 MG tablet Take 1 tablet (600 mg total) by mouth every 6 (six) hours. 50 tablet 0  . metFORMIN (GLUCOPHAGE) 500 MG tablet Take 1 tablet (500 mg total) by mouth 2 (two) times daily with a meal. 60 tablet 11  . oxyCODONE-acetaminophen (PERCOCET/ROXICET) 5-325 MG tablet Take 1 tablet by mouth every 6 (six) hours as needed (pain scale 4-7). 14 tablet 0  . Prenatal Vit-Fe Fumarate-FA (PRENATAL MULTIVITAMIN) TABS tablet Take 1 tablet by mouth daily at 12 noon.    . valACYclovir (VALTREX) 500 MG tablet Take 1 tablet (500 mg total) by mouth 2 (two)  times daily. 60 tablet 6   No Known Allergies  ROS:  Review of Systems  Constitutional: Negative for chills, fatigue and fever.  Respiratory: Negative for shortness of breath.   Cardiovascular: Negative for chest pain.  Genitourinary: Negative for difficulty urinating, dysuria, flank pain, pelvic pain, vaginal bleeding, vaginal discharge and vaginal pain.  Skin: Positive for wound.       Drainage of pink fluid from C/S incision  Neurological: Negative for dizziness and headaches.  Psychiatric/Behavioral: Negative.      I have reviewed patient's Past Medical Hx, Surgical Hx, Family Hx, Social Hx, medications and allergies.   Physical Exam   Patient Vitals for the past 24 hrs:  BP Temp Temp src Pulse Resp SpO2  12/21/17 2059 (!) 130/59 (!) 97.4 F  (36.3 C) Oral 80 17 100 %   Constitutional: Well-developed, well-nourished female in no acute distress.  Cardiovascular: normal rate Respiratory: normal effort GI: Abd soft, non-tender. Pos BS x 4 MS: Extremities nontender, no edema, normal ROM Neurologic: Alert and oriented x 4.  GU: Neg CVAT. Skin/incision: Incision well approximated, with continuous seeping of pink/light red fluid, no edema or erythema noted    LAB RESULTS No results found for this or any previous visit (from the past 24 hour(s)).  --/--/B POS (01/04 1627)  IMAGING Ct Abdomen Pelvis W Contrast  Result Date: 12/21/2017 CLINICAL DATA:  Recent C-section with drainage from the incision EXAM: CT ABDOMEN AND PELVIS WITH CONTRAST TECHNIQUE: Multidetector CT imaging of the abdomen and pelvis was performed using the standard protocol following bolus administration of intravenous contrast. CONTRAST:  197mL ISOVUE-300 IOPAMIDOL (ISOVUE-300) INJECTION 61% COMPARISON:  Ultrasound 12/10/2017 FINDINGS: Lower chest: No acute abnormality. Hepatobiliary: 17 mm cyst in the right hepatic lobe. No calcified gallstones or biliary dilatation Pancreas: Unremarkable. No pancreatic ductal dilatation or surrounding inflammatory changes. Spleen: Normal in size without focal abnormality. Adrenals/Urinary Tract: Adrenal glands are unremarkable. Kidneys are normal, without renal calculi, focal lesion, or hydronephrosis. Bladder is unremarkable. Delayed images through the inferior poles of the kidneys and bladder were obtained. Stomach/Bowel: Stomach is within normal limits. Appendix appears normal. No evidence of bowel wall thickening, distention, or inflammatory changes. Vascular/Lymphatic: No significant vascular findings are present. No enlarged abdominal or pelvic lymph nodes. Reproductive: Enlarged uterus consistent with recent postpartum status. Large mass arising from the anterior uterus, this measures approximately 9.7 x 8.7 cm and appears to  correspond to the ultrasound fibroid. No adnexal mass. Other: Negative for free air or free fluid. Soft tissue stranding within the lower anterior abdominal wall with skin thickening present. Fluid collection within the lower subcutaneous fat measuring 16 x 84 mm, no strong rim enhancement. Mild soft tissue edema and fluid within the lower rectus. Musculoskeletal: No acute or significant osseous findings. IMPRESSION: 1. Enlarged uterus consistent with recent postpartum status. 9.7 cm uterine mass consistent with a large fibroid 2. Mild skin thickening of the lower abdominal wall with underlying soft tissue stranding, question normal postoperative changes versus mild cellulitis. Oblong fluid collection in the subcutaneous fat of the lower anterior abdominopelvic wall, may represent residual postsurgical fluid collection; there is no internal gas or strong rim enhancement to favor an abscess. There is edema and soft tissue stranding within and around the lower rectus sheath with small fluid present, which may also be related to recent postsurgical status. Electronically Signed   By: Donavan Foil M.D.   On: 12/21/2017 19:57     MAU Management/MDM: Ordered labs and CT scan and  reviewed results.  Consult Dr Kennon Rounds with assessment and findings.  Dr Kennon Rounds to bedside and additional exam performed with evaluation of incision with sterile swab. Fascia intact with likely seroma leaking fluid.  No evidence of infection Will treat prophylactically with abx and close f/u in office.  Rx for Keflex sent to pharmacy. Pt to f/u in 3-4 days in the office.  Return to MAU as needed for emergencies. Pt discharged with strict infection precautions.  ASSESSMENT 1. Cesarean section wound seroma, postpartum   2. Draining postoperative wound, initial encounter     PLAN Discharge home Allergies as of 12/21/2017   No Known Allergies     Medication List    TAKE these medications   amLODipine 5 MG tablet Commonly known as:   NORVASC Take 1 tablet (5 mg total) by mouth daily.   cephALEXin 500 MG capsule Commonly known as:  KEFLEX Take 1 capsule (500 mg total) by mouth 4 (four) times daily.   ibuprofen 600 MG tablet Commonly known as:  ADVIL,MOTRIN Take 1 tablet (600 mg total) by mouth every 6 (six) hours.   metFORMIN 500 MG tablet Commonly known as:  GLUCOPHAGE Take 1 tablet (500 mg total) by mouth 2 (two) times daily with a meal.   oxyCODONE-acetaminophen 5-325 MG tablet Commonly known as:  PERCOCET/ROXICET Take 1 tablet by mouth every 6 (six) hours as needed (pain scale 4-7).   prenatal multivitamin Tabs tablet Take 1 tablet by mouth daily at 12 noon.   valACYclovir 500 MG tablet Commonly known as:  VALTREX Take 1 tablet (500 mg total) by mouth 2 (two) times daily.      Follow-up [place] for [place] Follow up.   Specialty:  Obstetrics and Gynecology Why:  Call the office to schedule follow up incision check on Thursday or Friday. Return to MAU with emergencies.  Contact information: [facility] [place] Certified Nurse-Midwife 12/22/2017  8:44 PM

## 2017-12-21 NOTE — Progress Notes (Signed)
OB Note Ask to see patient's skin incision. pt denies lifting anything heavy and states watery/pink drainage started this AM.   The skin looks well approximated, nttp, and from the midline to the left lateral edge it is constantly drainage sero-sang fluid. It doesn't have a smell and constantly comes out without pressure. No e/o hernia  Will take to MAU to rule out fascial dehiscence  Durene Romans MD Attending [facility] [place] (Faculty [facility]) 12/21/2017 Time: [id number]

## 2017-12-21 NOTE — MAU Note (Signed)
Pt had C/S on Jan 6, incision started leaking, drainage is bloody, soaked through her clothes.  Denies pain.

## 2017-12-24 ENCOUNTER — Other Ambulatory Visit: Payer: Medicaid Other

## 2017-12-24 ENCOUNTER — Encounter: Payer: Medicaid Other | Admitting: Obstetrics & Gynecology

## 2017-12-28 ENCOUNTER — Ambulatory Visit: Payer: Medicaid Other

## 2017-12-31 ENCOUNTER — Ambulatory Visit (INDEPENDENT_AMBULATORY_CARE_PROVIDER_SITE_OTHER): Payer: Medicaid Other | Admitting: General Practice

## 2017-12-31 VITALS — BP 133/61 | HR 76 | Ht 62.0 in | Wt 234.0 lb

## 2017-12-31 DIAGNOSIS — Z5189 Encounter for other specified aftercare: Secondary | ICD-10-CM

## 2017-12-31 NOTE — Progress Notes (Signed)
Patient here for follow up wound check today. Patient reports taking prescribed antibiotics. Incision has small 1cm open area with scant drainage. Incision otherwise well approximated. Incision cleaned with saline, dried drainage present. Dr Kennon Rounds came in to assess incision & apply silver nitrate to close incision. Wound care & cleaning instructions were reviewed with patient. Reassurance given. Patient had no questions

## 2018-01-01 NOTE — Progress Notes (Signed)
Patient seen and assessed by nursing staff.  Agree with documentation and plan. I saw and examined the wound. She continues on Abx to prevent infection with wound seroma--draining far less. Small open area treated with AgNO3.

## 2018-01-22 ENCOUNTER — Telehealth: Payer: Self-pay | Admitting: Family Medicine

## 2018-01-22 NOTE — Telephone Encounter (Signed)
Patient along with Baxter Flattery form [employer], they are calling requesting information about [place] and her Hypertension during her pregnancy, she stated the "Dr.Pratt" Told her she need to stop working because of that but she said she didn't stop working she just cut her hours back, [employer] is involved because they are trying to help her pay her [employer] and they need that information to prove why she cut her hours back,  I spoke to "Euharlee ) she stated she will look into this and contact the patient.

## 2018-01-22 NOTE — Telephone Encounter (Signed)
Spoke with patient via phone.  States she has worked out what she needed and no longer needs any verification from [place].  Patient states she has another question regarding her FMLA papers.  States her paperwork says she can return to work on 02/08/18.  States she thought it would be longer.  I explained for c-sections we give 8 weeks.  I explained the difference between short term disability/medical leave and FMLA/job protected leave of up to 12 weeks.  Patient states she understands and will contact her employer to get additional time.  Patient states she has no additional questions right now.

## 2018-01-25 ENCOUNTER — Ambulatory Visit: Payer: Medicaid Other | Admitting: Obstetrics and Gynecology

## 2018-01-27 ENCOUNTER — Ambulatory Visit: Payer: Medicaid Other | Admitting: Obstetrics and Gynecology

## 2018-02-08 ENCOUNTER — Ambulatory Visit: Payer: Medicaid Other | Admitting: Obstetrics and Gynecology

## 2018-03-03 ENCOUNTER — Encounter: Payer: Self-pay | Admitting: Obstetrics and Gynecology

## 2018-03-03 ENCOUNTER — Ambulatory Visit (INDEPENDENT_AMBULATORY_CARE_PROVIDER_SITE_OTHER): Payer: Medicaid Other | Admitting: Obstetrics and Gynecology

## 2018-03-03 VITALS — BP 137/75 | HR 55

## 2018-03-03 DIAGNOSIS — Z9109 Other allergy status, other than to drugs and biological substances: Secondary | ICD-10-CM

## 2018-03-03 DIAGNOSIS — O139 Gestational [pregnancy-induced] hypertension without significant proteinuria, unspecified trimester: Secondary | ICD-10-CM

## 2018-03-03 DIAGNOSIS — Z1389 Encounter for screening for other disorder: Secondary | ICD-10-CM

## 2018-03-03 DIAGNOSIS — O24419 Gestational diabetes mellitus in pregnancy, unspecified control: Secondary | ICD-10-CM

## 2018-03-03 DIAGNOSIS — Z3009 Encounter for other general counseling and advice on contraception: Secondary | ICD-10-CM | POA: Diagnosis not present

## 2018-03-03 DIAGNOSIS — Z86018 Personal history of other benign neoplasm: Secondary | ICD-10-CM

## 2018-03-03 NOTE — Patient Instructions (Signed)
Intrauterine Device Information An intrauterine device (IUD) is inserted into your uterus to prevent pregnancy. There are two types of IUDs available:  Copper IUD-This type of IUD is wrapped in copper wire and is placed inside the uterus. Copper makes the uterus and fallopian tubes produce a fluid that kills sperm. The copper IUD can stay in place for 10 years.  Hormone IUD-This type of IUD contains the hormone progestin (synthetic progesterone). The hormone thickens the cervical mucus and prevents sperm from entering the uterus. It also thins the uterine lining to prevent implantation of a fertilized egg. The hormone can weaken or kill the sperm that get into the uterus. One type of hormone IUD can stay in place for 5 years, and another type can stay in place for 3 years.  Your health care provider will make sure you are a good candidate for a contraceptive IUD. Discuss with your health care provider the possible side effects. Advantages of an intrauterine device  IUDs are highly effective, reversible, long acting, and low maintenance.  There are no estrogen-related side effects.  An IUD can be used when breastfeeding.  IUDs are not associated with weight gain.  The copper IUD works immediately after insertion.  The hormone IUD works right away if inserted within 7 days of your period starting. You will need to use a backup method of birth control for 7 days if the hormone IUD is inserted at any other time in your cycle.  The copper IUD does not interfere with your female hormones.  The hormone IUD can make heavy menstrual periods lighter and decrease cramping.  The hormone IUD can be used for 3 or 5 years.  The copper IUD can be used for 10 years. Disadvantages of an intrauterine device  The hormone IUD can be associated with irregular bleeding patterns.  The copper IUD can make your menstrual flow heavier and more painful.  You may experience cramping and vaginal bleeding after  insertion. This information is not intended to replace advice given to you by your health care provider. Make sure you discuss any questions you have with your health care provider. Document Released: 10/28/2004 Document Revised: 05/01/2016 Document Reviewed: 05/15/2013 Elsevier Interactive Patient Education  2017 Elsevier Inc. Etonogestrel implant What is this medicine? ETONOGESTREL (et oh noe JES trel) is a contraceptive (birth control) device. It is used to prevent pregnancy. It can be used for up to 3 years. This medicine may be used for other purposes; ask your health care provider or pharmacist if you have questions. COMMON BRAND NAME(S): Implanon, Nexplanon What should I tell my health care provider before I take this medicine? They need to know if you have any of these conditions: -abnormal vaginal bleeding -blood vessel disease or blood clots -cancer of the breast, cervix, or liver -depression -diabetes -gallbladder disease -headaches -heart disease or recent heart attack -high blood pressure -high cholesterol -kidney disease -liver disease -renal disease -seizures -tobacco smoker -an unusual or allergic reaction to etonogestrel, other hormones, anesthetics or antiseptics, medicines, foods, dyes, or preservatives -pregnant or trying to get pregnant -breast-feeding How should I use this medicine? This device is inserted just under the skin on the inner side of your upper arm by a health care professional. Talk to your pediatrician regarding the use of this medicine in children. Special care may be needed. Overdosage: If you think you have taken too much of this medicine contact a poison control center or emergency room at once. NOTE: This medicine is   only for you. Do not share this medicine with others. What if I miss a dose? This does not apply. What may interact with this medicine? Do not take this medicine with any of the following  medications: -amprenavir -bosentan -fosamprenavir This medicine may also interact with the following medications: -barbiturate medicines for inducing sleep or treating seizures -certain medicines for fungal infections like ketoconazole and itraconazole -grapefruit juice -griseofulvin -medicines to treat seizures like carbamazepine, felbamate, oxcarbazepine, phenytoin, topiramate -modafinil -phenylbutazone -rifampin -rufinamide -some medicines to treat HIV infection like atazanavir, indinavir, lopinavir, nelfinavir, tipranavir, ritonavir -St. John's wort This list may not describe all possible interactions. Give your health care provider a list of all the medicines, herbs, non-prescription drugs, or dietary supplements you use. Also tell them if you smoke, drink alcohol, or use illegal drugs. Some items may interact with your medicine. What should I watch for while using this medicine? This product does not protect you against HIV infection (AIDS) or other sexually transmitted diseases. You should be able to feel the implant by pressing your fingertips over the skin where it was inserted. Contact your doctor if you cannot feel the implant, and use a non-hormonal birth control method (such as condoms) until your doctor confirms that the implant is in place. If you feel that the implant may have broken or become bent while in your arm, contact your healthcare provider. What side effects may I notice from receiving this medicine? Side effects that you should report to your doctor or health care professional as soon as possible: -allergic reactions like skin rash, itching or hives, swelling of the face, lips, or tongue -breast lumps -changes in emotions or moods -depressed mood -heavy or prolonged menstrual bleeding -pain, irritation, swelling, or bruising at the insertion site -scar at site of insertion -signs of infection at the insertion site such as fever, and skin redness, pain or  discharge -signs of pregnancy -signs and symptoms of a blood clot such as breathing problems; changes in vision; chest pain; severe, sudden headache; pain, swelling, warmth in the leg; trouble speaking; sudden numbness or weakness of the face, arm or leg -signs and symptoms of liver injury like dark yellow or brown urine; general ill feeling or flu-like symptoms; light-colored stools; loss of appetite; nausea; right upper belly pain; unusually weak or tired; yellowing of the eyes or skin -unusual vaginal bleeding, discharge -signs and symptoms of a stroke like changes in vision; confusion; trouble speaking or understanding; severe headaches; sudden numbness or weakness of the face, arm or leg; trouble walking; dizziness; loss of balance or coordination Side effects that usually do not require medical attention (report to your doctor or health care professional if they continue or are bothersome): -acne -back pain -breast pain -changes in weight -dizziness -general ill feeling or flu-like symptoms -headache -irregular menstrual bleeding -nausea -sore throat -vaginal irritation or inflammation This list may not describe all possible side effects. Call your doctor for medical advice about side effects. You may report side effects to FDA at 1-800-FDA-1088. Where should I keep my medicine? This drug is given in a hospital or clinic and will not be stored at home. NOTE: This sheet is a summary. It may not cover all possible information. If you have questions about this medicine, talk to your doctor, pharmacist, or health care provider.  2018 Elsevier/Gold Standard (2016-06-12 11:19:22)  

## 2018-03-03 NOTE — Progress Notes (Signed)
error 

## 2018-03-03 NOTE — Progress Notes (Signed)
Obstetrics/Postpartum Visit  Appointment Date: 03/03/2018  OBGYN Clinic: [facility]  Primary Care Provider: Patient, No Pcp Per  Chief Complaint:  Chief Complaint  Patient presents with  . Postpartum Care    History of Present Illness: Paula Bates is a 42 y.o. African-American [phone] (Patient's last menstrual period was 02/06/2018 (approximate).), seen for the above chief complaint. Her past medical history is significant for bladder cancer (superficial transitional cell carcinoma), followed at [facility]. She had resection of bladder tumor yesterday.  She is s/p 1LTCS on 12/13/2017 at 56 weeks for gestational HTN and breech position with anterior fibroid making ECV impossible; she was discharged to home on POD#2. 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  Complains of headache post partum, stopped taking norvasc or metformin and headaches improved.  Vaginal bleeding or discharge: No  Breast or formula feeding: bottle Intercourse: Yes  Contraception: none, wants to research PP depression s/s: No  Any bowel or bladder issues: No  Pap smear: no abnormalities (date: 06/2017)  Review of Systems: Positive for itchy, watery eyes and allergies.   Her 12 point review of systems is negative or as noted in the History of Present Illness.  Patient Active Problem List   Diagnosis Date Noted  . Hypertension 12/15/2017  . S/P cesarean section 12/13/2017  . Breech presentation, no version   . Gestational hypertension 12/10/2017  . Vitamin D deficiency 10/19/2017  . Sickle cell trait ([place]) 10/13/2017  . 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 Gestational diabetes 10/12/2017  . Supervision of high risk pregnancy, antepartum, third trimester 10/12/2017  . H/O pre-eclampsia in prior pregnancy, currently pregnant 10/12/2017  . H/O preterm delivery, currently pregnant 10/12/2017  . Herpes simplex infection   . Uterine leiomyoma 07/13/2017  . 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  Obesity, unspecified 03/29/2014    Medications Paula Bates had no medications administered during this visit. Current Outpatient Medications  Medication Sig Dispense Refill  . ibuprofen (ADVIL,MOTRIN) 600 MG tablet Take 1 tablet (600 mg total) by mouth every 6 (six) hours. 50 tablet 0  . amLODipine (NORVASC) 5 MG tablet Take 1 tablet (5 mg total) by mouth daily. (Patient not taking: Reported on 03/03/2018) 30 tablet 11  . cephALEXin (KEFLEX) 500 MG capsule Take 1 capsule (500 mg total) by mouth 4 (four) times daily. (Patient not taking: Reported on 03/03/2018) 28 capsule 2  . metFORMIN (GLUCOPHAGE) 500 MG tablet Take 1 tablet (500 mg total) by mouth 2 (two) times daily with a meal. (Patient not taking: Reported on 03/03/2018) 60 tablet 11  . oxyCODONE-acetaminophen (PERCOCET/ROXICET) 5-325 MG tablet Take 1 tablet by mouth every 6 (six) hours as needed (pain scale 4-7). (Patient not taking: Reported on 03/03/2018) 14 tablet 0  . Prenatal Vit-Fe Fumarate-FA (PRENATAL MULTIVITAMIN) TABS tablet Take 1 tablet by mouth daily at 12 noon.    . valACYclovir (VALTREX) 500 MG tablet Take 1 tablet (500 mg total) by mouth 2 (two) times daily. (Patient not taking: Reported on 03/03/2018) 60 tablet 6   No current facility-administered medications for this visit.     Allergies Patient has no known allergies.  Physical Exam:  BP 137/75   Pulse (!) 55   LMP 02/06/2018 (Approximate)   Breastfeeding? No  There is no height or weight on file to calculate BMI. General appearance: Well nourished, well developed female in no acute distress. Red, watery eyes Cardiovascular: regular rate and rhythm Respiratory:  Normal respiratory effort Abdomen:  no masses, hernias; diffusely non tender  to palpation, non distended, pfannenstiel incision healing well Breasts: not examined. Neuro/Psych:  Normal mood and affect.  Skin:  Warm and dry.   PP Depression Screening:  negative  Assessment: Patient is a 42 y.o.  [id number] who is 11 weeks post partum from a primary c-section for breech, gestational HTN, gDMA2. She is doing okay, had resection of bladder tumor yesterday.   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 Arvilla Meres, M.D. Attending [place], [facility] for [employer], [place]

## 2018-03-18 ENCOUNTER — Other Ambulatory Visit: Payer: Medicaid Other

## 2018-07-27 ENCOUNTER — Telehealth: Payer: Self-pay | Admitting: Obstetrics and Gynecology

## 2018-07-27 NOTE — Telephone Encounter (Signed)
Contacted pt and informed pt that insurance does not cover screening mammograms unless you are the age of 27.  I asked pt if she was having any breast issues. Pt stated " I see an endocrinologist and I have high prolactin levels, I have breast secretions."  I advised the pt to make an appt for evaluation.  Pt stated that she would make an appt.

## 2018-07-29 IMAGING — CT CT ABD-PELV W/ CM
2 of 5 series · 16 of 46 positions shown, 18 images · IV contrast (iopamidol)
Comparison: Ultrasound 12/10/2017

CLINICAL DATA: Recent C-section with drainage from the incision

EXAM:
CT ABDOMEN AND PELVIS WITH CONTRAST
TECHNIQUE: Multidetector CT imaging of the abdomen and pelvis was performed
using the standard protocol following bolus administration of
intravenous contrast.
CONTRAST:  100mL [id number] IOPAMIDOL ([id number]) INJECTION 61%

[Series 2: routine abdomen/pelvis with · axial · 0.85mm/px · z∈[+644,+1074]mm · 13 of 98 slices shown, 15 images]
[im 6/98  soft-tissue]
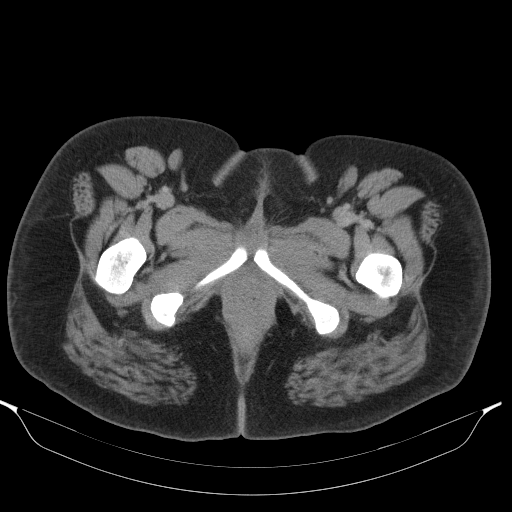
[im 6/98  bone]
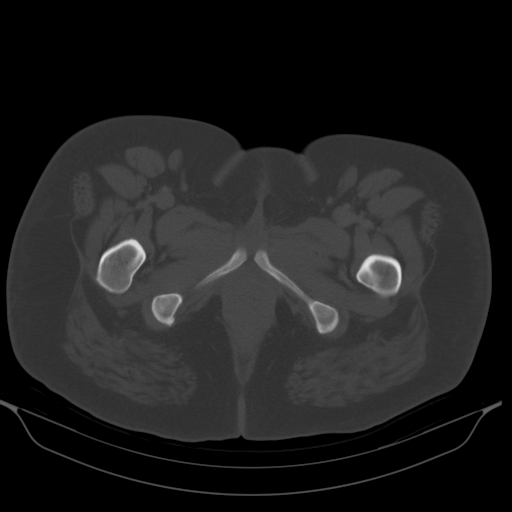
[im 12/98  soft-tissue]
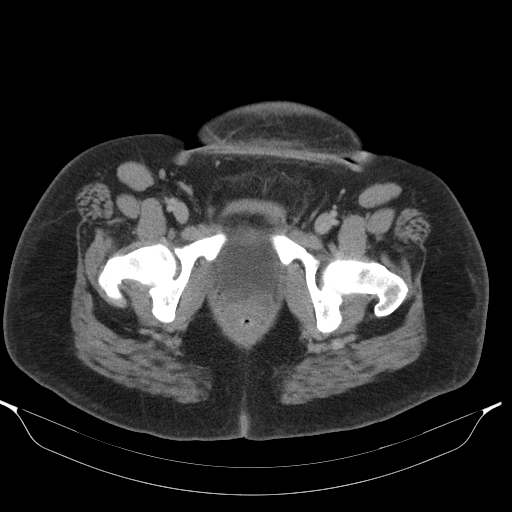
[im 23/98  soft-tissue]
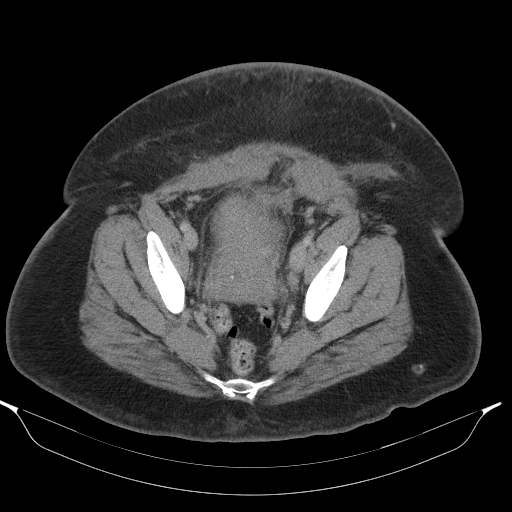
[im 29/98  soft-tissue]
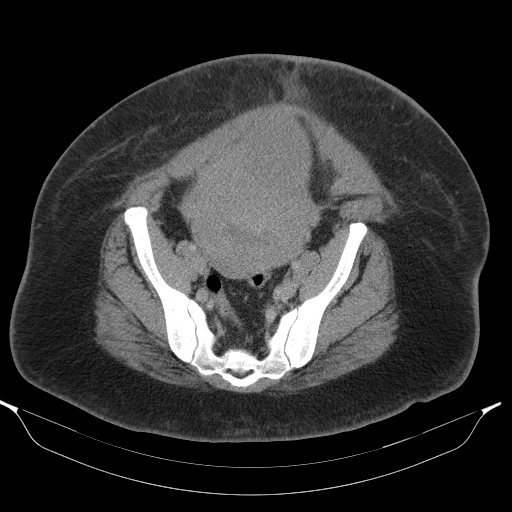
[im 35/98  soft-tissue]
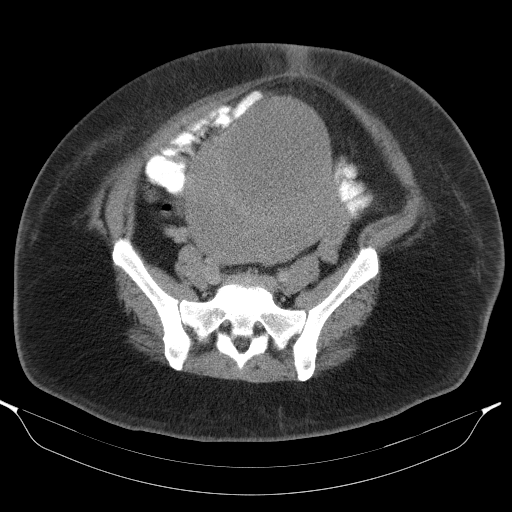
[im 40/98  soft-tissue]
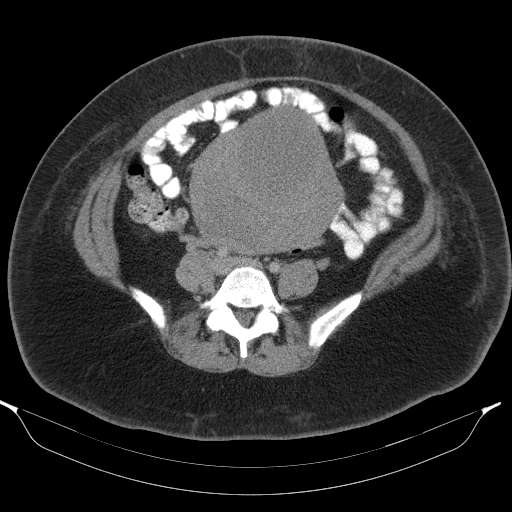
[im 52/98  soft-tissue]
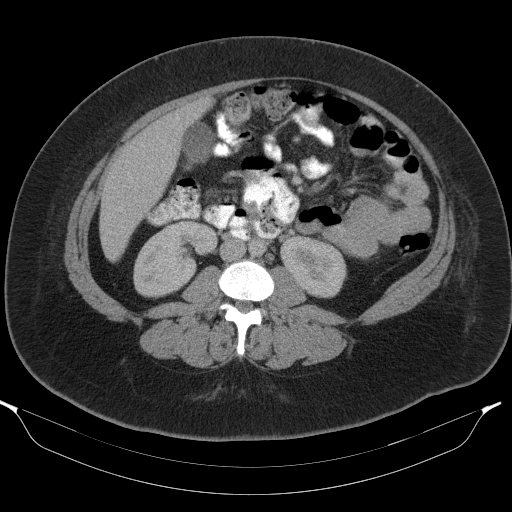
[im 58/98  soft-tissue]
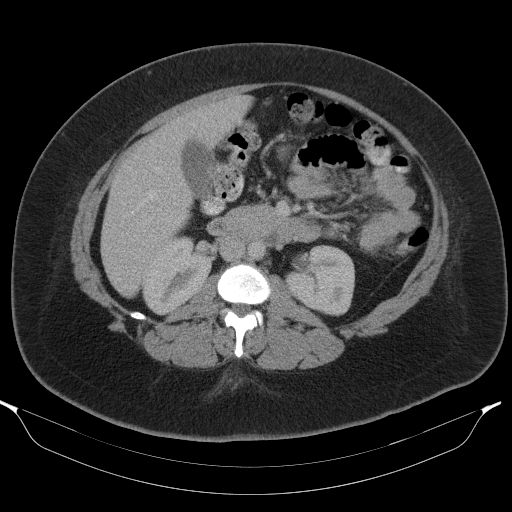
[im 63/98  soft-tissue]
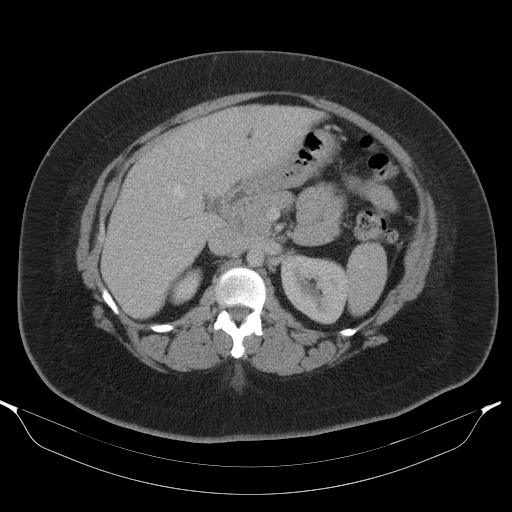
[im 63/98  bone]
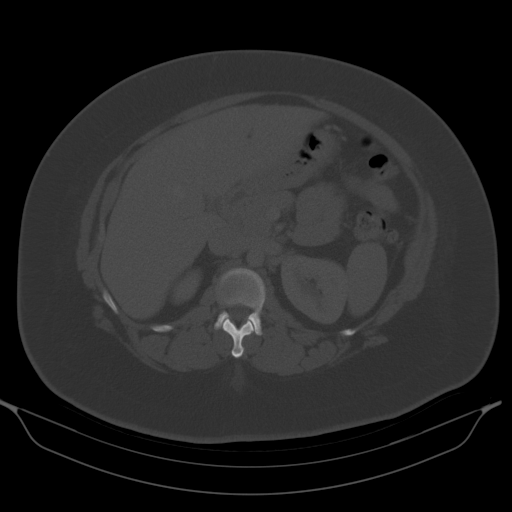
[im 69/98  soft-tissue]
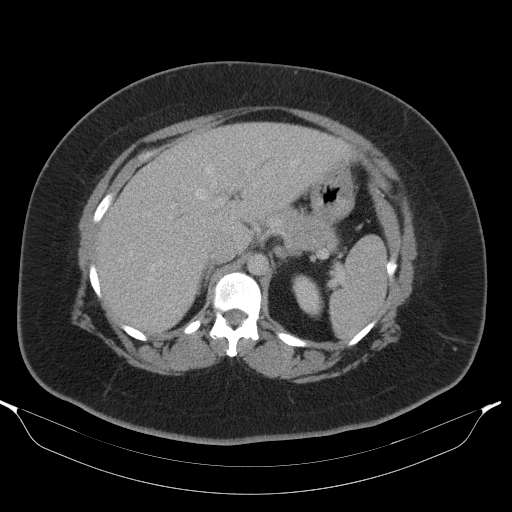
[im 75/98  soft-tissue]
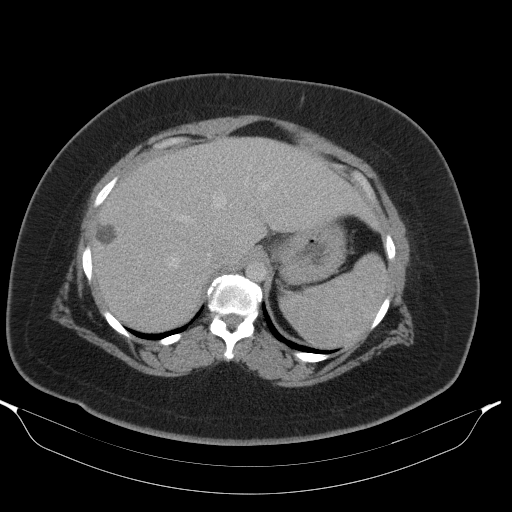
[im 86/98  soft-tissue]
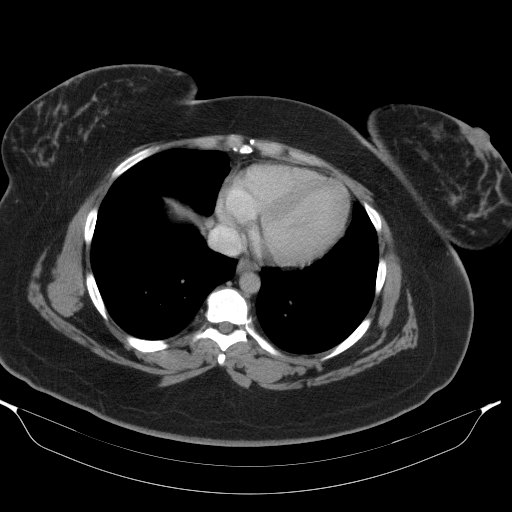
[im 92/98  soft-tissue]
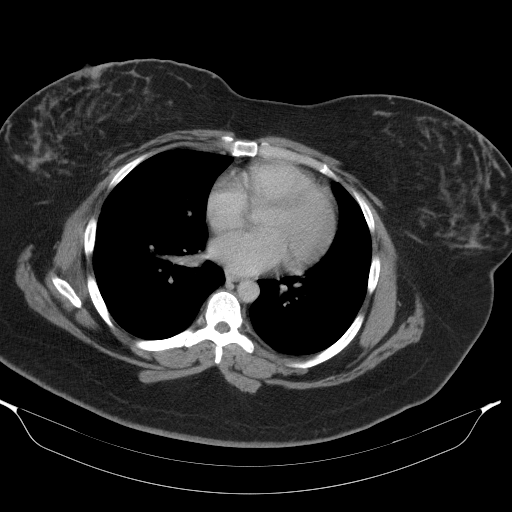

[Series 602: <mpr thick range> · coronal · 0.96mm/px · 3 of 172 slices shown]
[im 58/172  soft-tissue]
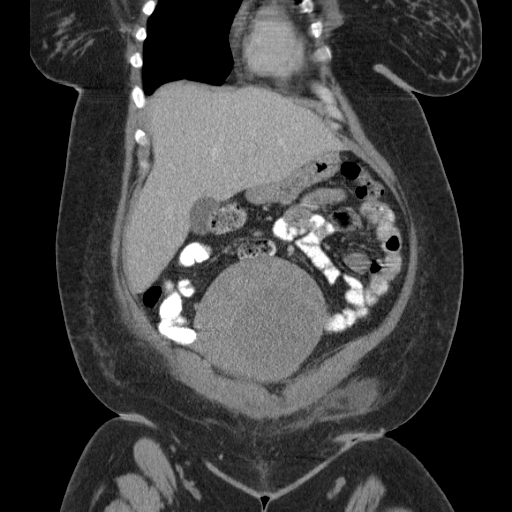
[im 77/172  soft-tissue]
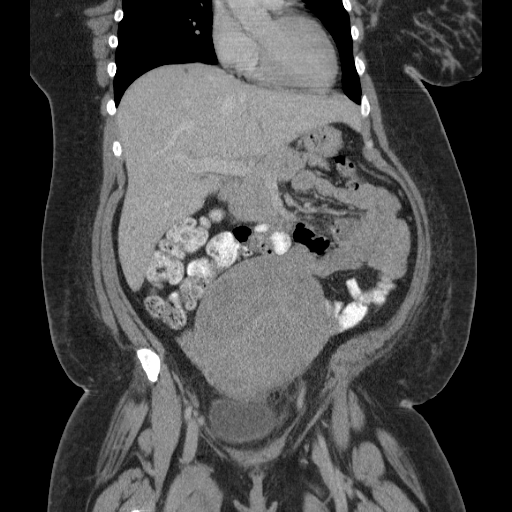
[im 96/172  soft-tissue]
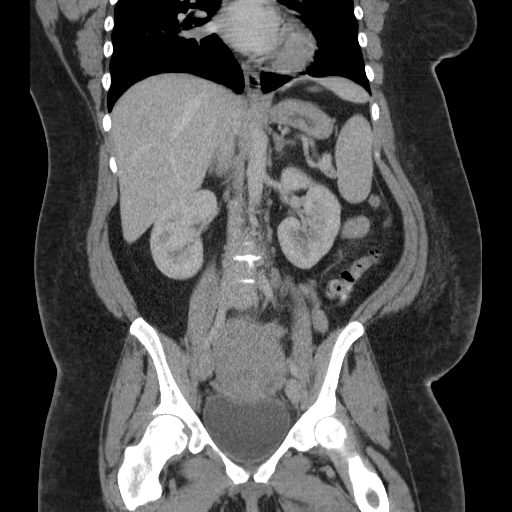

[16 of 46 positions shown; findings below may reference images not displayed]

FINDINGS: Lower chest: No acute abnormality.

Hepatobiliary: 17 mm cyst in the right hepatic lobe. No calcified
gallstones or biliary dilatation

Pancreas: Unremarkable. No pancreatic ductal dilatation or
surrounding inflammatory changes.

Spleen: Normal in size without focal abnormality.

Adrenals/Urinary Tract: Adrenal glands are unremarkable. Kidneys are
normal, without renal calculi, focal lesion, or hydronephrosis.
Bladder is unremarkable. Delayed images through the inferior poles
of the kidneys and bladder were obtained.

Stomach/Bowel: Stomach is within normal limits. Appendix appears
normal. No evidence of bowel wall thickening, distention, or
inflammatory changes.

Vascular/Lymphatic: No significant vascular findings are present. No
enlarged abdominal or pelvic lymph nodes.

Reproductive: Enlarged uterus consistent with recent postpartum
status. Large mass arising from the anterior uterus, this measures
approximately 9.7 x 8.7 cm and appears to correspond to the
ultrasound fibroid. No adnexal mass.

Other: Negative for free air or free fluid. Soft tissue stranding
within the lower anterior abdominal wall with skin thickening
present. Fluid collection within the lower subcutaneous fat
measuring 16 x 84 mm, no strong rim enhancement. Mild soft tissue
edema and fluid within the lower rectus.

Musculoskeletal: No acute or significant osseous findings.
IMPRESSION: 1. Enlarged uterus consistent with recent postpartum status. 9.7 cm
uterine mass consistent with a large fibroid
2. Mild skin thickening of the lower abdominal wall with underlying
soft tissue stranding, question normal postoperative changes versus
mild cellulitis. Oblong fluid collection in the subcutaneous fat of
the lower anterior abdominopelvic wall, may represent residual
postsurgical fluid collection; there is no internal gas or strong
rim enhancement to favor an abscess. There is edema and soft tissue
stranding within and around the lower rectus sheath with small fluid
present, which may also be related to recent postsurgical status.

## 2018-09-08 ENCOUNTER — Ambulatory Visit: Payer: Medicaid Other | Admitting: Obstetrics and Gynecology

## 2018-09-15 ENCOUNTER — Ambulatory Visit: Payer: Medicaid Other | Admitting: Obstetrics and Gynecology

## 2018-11-17 ENCOUNTER — Other Ambulatory Visit: Payer: Self-pay | Admitting: Family Medicine

## 2018-11-17 ENCOUNTER — Other Ambulatory Visit: Payer: Self-pay | Admitting: Obstetrics and Gynecology

## 2018-11-17 DIAGNOSIS — Z1231 Encounter for screening mammogram for malignant neoplasm of breast: Secondary | ICD-10-CM

## 2018-11-23 ENCOUNTER — Other Ambulatory Visit: Payer: Self-pay | Admitting: Family Medicine

## 2018-11-23 ENCOUNTER — Other Ambulatory Visit: Payer: Self-pay | Admitting: Physician Assistant

## 2018-11-23 DIAGNOSIS — E221 Hyperprolactinemia: Secondary | ICD-10-CM

## 2019-06-19 ENCOUNTER — Emergency Department (HOSPITAL_COMMUNITY): Payer: BLUE CROSS/BLUE SHIELD

## 2019-06-19 ENCOUNTER — Encounter (HOSPITAL_COMMUNITY): Payer: Self-pay

## 2019-06-19 ENCOUNTER — Other Ambulatory Visit: Payer: Self-pay

## 2019-06-19 ENCOUNTER — Emergency Department (HOSPITAL_COMMUNITY)
Admission: EM | Admit: 2019-06-19 | Discharge: 2019-06-19 | Disposition: A | Discharge status: Home / Self Care | Payer: BLUE CROSS/BLUE SHIELD | Attending: Emergency Medicine | Admitting: Emergency Medicine

## 2019-06-19 DIAGNOSIS — M25571 Pain in right ankle and joints of right foot: Secondary | ICD-10-CM | POA: Diagnosis not present

## 2019-06-19 DIAGNOSIS — E119 Type 2 diabetes mellitus without complications: Secondary | ICD-10-CM | POA: Insufficient documentation

## 2019-06-19 DIAGNOSIS — Y92018 Other place in single-family (private) house as the place of occurrence of the external cause: Secondary | ICD-10-CM | POA: Insufficient documentation

## 2019-06-19 DIAGNOSIS — Y999 Unspecified external cause status: Secondary | ICD-10-CM | POA: Diagnosis not present

## 2019-06-19 DIAGNOSIS — M25572 Pain in left ankle and joints of left foot: Secondary | ICD-10-CM | POA: Insufficient documentation

## 2019-06-19 DIAGNOSIS — Z79899 Other long term (current) drug therapy: Secondary | ICD-10-CM | POA: Diagnosis not present

## 2019-06-19 DIAGNOSIS — Z7984 Long term (current) use of oral hypoglycemic drugs: Secondary | ICD-10-CM | POA: Insufficient documentation

## 2019-06-19 DIAGNOSIS — Z87891 Personal history of nicotine dependence: Secondary | ICD-10-CM | POA: Diagnosis not present

## 2019-06-19 DIAGNOSIS — W133XXA Fall through floor, initial encounter: Secondary | ICD-10-CM | POA: Diagnosis not present

## 2019-06-19 DIAGNOSIS — W19XXXA Unspecified fall, initial encounter: Secondary | ICD-10-CM

## 2019-06-19 DIAGNOSIS — Z8551 Personal history of malignant neoplasm of bladder: Secondary | ICD-10-CM | POA: Diagnosis not present

## 2019-06-19 MED ORDER — IBUPROFEN 400 MG PO TABS
400.0000 mg | ORAL_TABLET | Freq: Once | ORAL | Status: AC
  Administered 2019-06-19: 400 mg via ORAL
  Filled 2019-06-19: qty 1

## 2019-06-19 NOTE — ED Triage Notes (Signed)
Pt reports falling through her porch yesterday, c.o pain in both ankles, pt ambulatory.

## 2019-06-19 NOTE — ED Notes (Signed)
Pt in hall on [address], [place] at bedside

## 2019-06-19 NOTE — ED Notes (Signed)
Patient verbalizes understanding of discharge instructions. Opportunity for questioning and answers were provided. Armband removed by staff, pt discharged from ED.  

## 2019-06-19 NOTE — ED Notes (Signed)
Pt refused temp

## 2019-06-19 NOTE — Discharge Instructions (Signed)
As discussed, your evaluation today has been largely reassuring.  But, it is important that you monitor your condition carefully, and do not hesitate to return to the ED if you develop new, or concerning changes in your condition.  Please use ibuprofen 400mg  three times daily for the next three days.  Use ice for additional pain management.

## 2019-06-19 NOTE — ED Provider Notes (Signed)
[facility] EMERGENCY DEPARTMENT Provider Note   CSN: [id number] Arrival date & time: 06/19/19  1942    History   Chief Complaint Chief Complaint  Patient presents with  . Fall  . Ankle Pain    HPI Paula Bates is a 43 y.o. female.     HPI Patient presents with concern of bilateral ankle pain. She presents a few hours after being in her usual state of health, when suddenly, the floor on her porch gave way. She fell approximately 4 feet onto the ground neither, which was uneven, index of concrete and Rex. She subsequently was able to ambulate, climbing out of the hole, but since that time has had severe pain in both ankles. No relief with Excedrin. No other injuries, no hip pain, knee pain, or other complaints. Patient acknowledges multiple medical issues, states that she does not take medication regularly, but defers evaluation of other concerns including diabetes. Past Medical History:  Diagnosis Date  . Bladder cancer ([place]) 12/2016  . Diabetes mellitus without complication (HCC)    gestational diabetes  . Herpes simplex infection    has never had outbreak, was tested in blood by family physician  . Hx of tonsillectomy    43 years old  . Infection    UTI  . Pregnancy induced hypertension   . Vaginal Pap smear, abnormal    cryotherapy, ok since    Patient Active Problem List   Diagnosis Date Noted  . Hypertension 12/15/2017  . S/P cesarean section 12/13/2017  . Breech presentation, no version   . Gestational hypertension 12/10/2017  . Vitamin D deficiency 10/19/2017  . Sickle cell trait ([place]) 10/13/2017  . 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 Gestational diabetes 10/12/2017  . Supervision of high risk pregnancy, antepartum, third trimester 10/12/2017  . H/O pre-eclampsia in prior pregnancy, currently pregnant 10/12/2017  . H/O preterm delivery, currently pregnant 10/12/2017  . Herpes simplex infection   . Uterine leiomyoma 07/13/2017  .  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 Prolactinoma ([place]) 03/29/2014  . Obesity, unspecified 03/29/2014    Past Surgical History:  Procedure Laterality Date  . BLADDER SURGERY  01/2016  . CESAREAN SECTION N/A 12/13/2017   Procedure: CESAREAN SECTION;  Surgeon: Florian Buff, MD;  Location: [facility];  Service: Obstetrics;  Laterality: N/A;  . TONSILLECTOMY  1996     OB History    Gravida  4   Para  3   Term  2   Preterm  1   AB  1   Living  3     SAB  1   TAB      Ectopic      Multiple  0   Live Births  3            Home Medications    Prior to Admission medications   Medication Sig Start Date End Date Taking? Authorizing Provider  amLODipine (NORVASC) 5 MG tablet Take 1 tablet (5 mg total) by mouth daily. Patient not taking: Reported on 03/03/2018 12/15/17 12/15/18  Larwance Rote, MD  cephALEXin (KEFLEX) 500 MG capsule Take 1 capsule (500 mg total) by mouth 4 (four) times daily. Patient not taking: Reported on 03/03/2018 12/21/17   Leftwich-Kirby, Kathie Dike, CNM  ibuprofen (ADVIL,MOTRIN) 600 MG tablet Take 1 tablet (600 mg total) by mouth every 6 (six) hours. 12/15/17   Larwance Rote, MD  metFORMIN (GLUCOPHAGE) 500 MG tablet Take 1 tablet (500 mg total) by mouth 2 (  two) times daily with a meal. Patient not taking: Reported on 03/03/2018 12/15/17 12/15/18  Larwance Rote, MD  oxyCODONE-acetaminophen (PERCOCET/ROXICET) 5-325 MG tablet Take 1 tablet by mouth every 6 (six) hours as needed (pain scale 4-7). Patient not taking: Reported on 03/03/2018 12/15/17   Larwance Rote, MD  Prenatal Vit-Fe Fumarate-FA (PRENATAL MULTIVITAMIN) TABS tablet Take 1 tablet by mouth daily at 12 noon.    [provider]  valACYclovir (VALTREX) 500 MG tablet Take 1 tablet (500 mg total) by mouth 2 (two) times daily. Patient not taking: Reported on 03/03/2018 11/23/17   Sloan Leiter, MD    Family History Family History  Problem Relation Age of Onset  . Diabetes Mother    . Hypertension Mother     Social History Social History   Tobacco Use  . Smoking status: Former Smoker    Packs/day: 0.25    Types: Cigarettes    Quit date: 10/15/2010    Years since quitting: 8.6  . Smokeless tobacco: Never Used  Substance Use Topics  . Alcohol use: No    Alcohol/week: 0.0 standard drinks    Frequency: Never    Comment: occ  . Drug use: No     Allergies   Patient has no known allergies.   Review of Systems Review of Systems  Constitutional:       Per HPI, otherwise negative  HENT:       Per HPI, otherwise negative  Respiratory:       Per HPI, otherwise negative  Cardiovascular:       Per HPI, otherwise negative  Gastrointestinal: Negative for vomiting.  Endocrine:       Negative aside from HPI  Genitourinary:       Neg aside from HPI   Musculoskeletal:       Per HPI, otherwise negative  Skin: Negative.   Neurological: Negative for syncope.     Physical Exam Updated Vital Signs BP 132/79   Pulse 86   Resp 19   LMP 06/19/2019   SpO2 100%   Physical Exam Vitals signs and nursing note reviewed.  Constitutional:      General: She is not in acute distress.    Appearance: She is well-developed.  HENT:     Head: Normocephalic and atraumatic.  Eyes:     Conjunctiva/sclera: Conjunctivae normal.  Cardiovascular:     Rate and Rhythm: Normal rate and regular rhythm.     Comments: Appropriate pulses PT, DP both feet. Pulmonary:     Effort: Pulmonary effort is normal. No respiratory distress.     Breath sounds: Normal breath sounds. No stridor.  Abdominal:     General: There is no distension.  Musculoskeletal:     Right knee: Normal.     Left knee: Normal.       Feet:  Skin:    General: Skin is warm and dry.  Neurological:     Mental Status: She is alert and oriented to person, place, and time.     Cranial Nerves: No cranial nerve deficit.      ED Treatments / Results  Labs (all labs ordered are listed, but only abnormal  results are displayed) Labs Reviewed - No data to display  EKG None  Radiology Dg Ankle Complete Left  Result Date: 06/19/2019 CLINICAL DATA:  [place] through porch bilateral ankle pain EXAM: LEFT ANKLE COMPLETE - 3+ VIEW COMPARISON:  None. FINDINGS: No fracture or malalignment. Diffuse soft tissue swelling. The ankle mortise is  symmetric. Moderate plantar calcaneal spur. IMPRESSION: Soft tissue swelling.  No acute osseous abnormality. Electronically Signed   By: Donavan Foil M.D.   On: 06/19/2019 22:05   Dg Ankle Complete Right  Result Date: 06/19/2019 CLINICAL DATA:  Fall with ankle pain EXAM: RIGHT ANKLE - COMPLETE 3+ VIEW COMPARISON:  None. FINDINGS: No fracture or malalignment. Ankle mortise is symmetric. Moderate plantar calcaneal spur. Soft tissue swelling is present. Spurring the inferolateral ankle joint. IMPRESSION: No acute osseous abnormality. Electronically Signed   By: Donavan Foil M.D.   On: 06/19/2019 22:07    Procedures Procedures (including critical care time)  Medications Ordered in ED Medications  ibuprofen (ADVIL) tablet 400 mg (400 mg Oral Given 06/19/19 2220)     Initial Impression / Assessment and Plan / ED Course  I have reviewed the triage vital signs and the nursing notes.  Pertinent labs & imaging results that were available during my care of the patient were reviewed by me and considered in my medical decision making (see chart for details).        10:39 PM On repeat exam the patient is in no distress. We discussed the x-ray findings, concern for contusion, but no evidence for fracture. She is awake, alert, sitting upright, speaking on her cellular telephone, with no evidence for acute new complaints. Patient discharged with ongoing cryotherapy, ibuprofen, a work note.  Final Clinical Impressions(s) / ED Diagnoses   Final diagnoses:  Fall, initial encounter     Carmin Muskrat, MD 06/19/19 2239

## 2019-08-28 ENCOUNTER — Encounter (HOSPITAL_COMMUNITY): Payer: Self-pay

## 2019-08-28 ENCOUNTER — Ambulatory Visit (HOSPITAL_COMMUNITY)
Admission: EM | Admit: 2019-08-28 | Discharge: 2019-08-28 | Disposition: A | Discharge status: Home / Self Care | Payer: BLUE CROSS/BLUE SHIELD | Attending: Family Medicine | Admitting: Family Medicine

## 2019-08-28 ENCOUNTER — Other Ambulatory Visit: Payer: Self-pay

## 2019-08-28 DIAGNOSIS — M25571 Pain in right ankle and joints of right foot: Secondary | ICD-10-CM

## 2019-08-28 DIAGNOSIS — M25572 Pain in left ankle and joints of left foot: Secondary | ICD-10-CM

## 2019-08-28 MED ORDER — MELOXICAM 7.5 MG PO TABS: 7.5000 mg | ORAL_TABLET | Freq: Every day | ORAL | 0 refills | Status: AC

## 2019-08-28 NOTE — ED Provider Notes (Signed)
[facility]    CSN: [id number] Arrival date & time: 08/28/19  1042      History   Chief Complaint Chief Complaint  Patient presents with  . Ankle Pain    HPI Paula Bates is a 43 y.o. female.   Paula Bates presents with complaints of bilateral ankle pain and swelling, R>L. This has been ongoing since injury/fall 06/19/2019. She fell through her porch, with immediate ankle pain. She was seen in the ed and have xrays which were normal. She has been ambulatory but she experiences throbbing pain. Worse at night. Some swelling. Pain 7/10. Has been taking tylenol and ibuprofen intermittently which haven't helped with pain. No swelling or pain up calves. Sometimes has some shortness of breath on exertion but no cough, no chest pain. No orthopnea. She is not currently taking metformin as she was unable to tolerate the gi side effects. She is not on any medications currently, hasn't followed with her PCP regularly. No numbness or weakness. Sometimes she will wrap her feet. Elevation helps some. History  Of bladder cancer, dm, tonsillectomy, htn.    ROS per HPI, negative if not otherwise mentioned.      Past Medical History:  Diagnosis Date  . Bladder cancer ([place]) 12/2016  . Diabetes mellitus without complication (HCC)    gestational diabetes  . Herpes simplex infection    has never had outbreak, was tested in blood by family physician  . Hx of tonsillectomy    43 years old  . Infection    UTI  . Pregnancy induced hypertension   . Vaginal Pap smear, abnormal    cryotherapy, ok since    Patient Active Problem List   Diagnosis Date Noted  . Hypertension 12/15/2017  . S/P cesarean section 12/13/2017  . Breech presentation, no version   . Gestational hypertension 12/10/2017  . Vitamin D deficiency 10/19/2017  . Sickle cell trait ([place]) 10/13/2017  . 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 Gestational diabetes 10/12/2017  . Supervision of high risk pregnancy, antepartum,  third trimester 10/12/2017  . H/O pre-eclampsia in prior pregnancy, currently pregnant 10/12/2017  . H/O preterm delivery, currently pregnant 10/12/2017  . Herpes simplex infection   . Uterine leiomyoma 07/13/2017  . 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 Prolactinoma ([place]) 03/29/2014  . Obesity, unspecified 03/29/2014    Past Surgical History:  Procedure Laterality Date  . BLADDER SURGERY  01/2016  . CESAREAN SECTION N/A 12/13/2017   Procedure: CESAREAN SECTION;  Surgeon: Florian Buff, MD;  Location: [facility];  Service: Obstetrics;  Laterality: N/A;  . TONSILLECTOMY  1996    OB History    Gravida  4   Para  3   Term  2   Preterm  1   AB  1   Living  3     SAB  1   TAB      Ectopic      Multiple  0   Live Births  3            Home Medications    Prior to Admission medications   Medication Sig Start Date End Date Taking? Authorizing Provider  amLODipine (NORVASC) 5 MG tablet Take 1 tablet (5 mg total) by mouth daily. Patient not taking: Reported on 03/03/2018 12/15/17 12/15/18  Larwance Rote, MD  cephALEXin (KEFLEX) 500 MG capsule Take 1 capsule (500 mg total) by mouth 4 (four) times daily. Patient not taking: Reported on 03/03/2018 12/21/17   Fatima Blank  A, CNM  ibuprofen (ADVIL,MOTRIN) 600 MG tablet Take 1 tablet (600 mg total) by mouth every 6 (six) hours. 12/15/17   Larwance Rote, MD  meloxicam (MOBIC) 7.5 MG tablet Take 1 tablet (7.5 mg total) by mouth daily. 08/28/19   Zigmund Gottron, NP  metFORMIN (GLUCOPHAGE) 500 MG tablet Take 1 tablet (500 mg total) by mouth 2 (two) times daily with a meal. Patient not taking: Reported on 03/03/2018 12/15/17 12/15/18  Larwance Rote, MD  oxyCODONE-acetaminophen (PERCOCET/ROXICET) 5-325 MG tablet Take 1 tablet by mouth every 6 (six) hours as needed (pain scale 4-7). Patient not taking: Reported on 03/03/2018 12/15/17   Larwance Rote, MD  Prenatal Vit-Fe Fumarate-FA (PRENATAL MULTIVITAMIN) TABS  tablet Take 1 tablet by mouth daily at 12 noon.    [provider]  valACYclovir (VALTREX) 500 MG tablet Take 1 tablet (500 mg total) by mouth 2 (two) times daily. Patient not taking: Reported on 03/03/2018 11/23/17   Sloan Leiter, MD    Family History Family History  Problem Relation Age of Onset  . Diabetes Mother   . Hypertension Mother     Social History Social History   Tobacco Use  . Smoking status: Former Smoker    Packs/day: 0.25    Types: Cigarettes    Quit date: 10/15/2010    Years since quitting: 8.8  . Smokeless tobacco: Never Used  Substance Use Topics  . Alcohol use: No    Alcohol/week: 0.0 standard drinks    Frequency: Never    Comment: occ  . Drug use: No     Allergies   Patient has no known allergies.   Review of Systems Review of Systems   Physical Exam Triage Vital Signs ED Triage Vitals  Enc Vitals Group     BP 08/28/19 1115 (!) 148/76     Pulse Rate 08/28/19 1115 95     Resp 08/28/19 1115 18     Temp 08/28/19 1115 98.2 F (36.8 C)     Temp Source 08/28/19 1115 Oral     SpO2 08/28/19 1115 97 %     Weight 08/28/19 1113 230 lb (104.3 kg)     Height --      Head Circumference --      Peak Flow --      Pain Score 08/28/19 1113 7     Pain Loc --      Pain Edu? --      Excl. in [place]? --    No data found.  Updated Vital Signs BP (!) 148/76 (BP Location: Right Arm)   Pulse 95   Temp 98.2 F (36.8 C) (Oral)   Resp 18   Wt 230 lb (104.3 kg)   LMP 08/26/2019   SpO2 97%   BMI 42.07 kg/m    Physical Exam Constitutional:      General: She is not in acute distress.    Appearance: She is well-developed.  Cardiovascular:     Rate and Rhythm: Normal rate.  Pulmonary:     Effort: Pulmonary effort is normal.     Breath sounds: Normal breath sounds.  Musculoskeletal:     Right ankle: She exhibits swelling. She exhibits normal range of motion, no ecchymosis, no deformity, no laceration and normal pulse. No tenderness.     Left  ankle: She exhibits swelling. She exhibits normal range of motion, no ecchymosis, no deformity, no laceration and normal pulse.     Comments: Palpable pedal pulses; non pitting edema  to bilateral ankles, primarily at lateral malleolus bilaterally; no redness or warmth; cap refill < 2 seconds bilaterally; full ROM; calves are soft and non tender   Skin:    General: Skin is warm and dry.  Neurological:     Mental Status: She is alert and oriented to person, place, and time.      UC Treatments / Results  Labs (all labs ordered are listed, but only abnormal results are displayed) Labs Reviewed - No data to display  EKG   Radiology No results found.  Procedures Procedures (including critical care time)  Medications Ordered in UC Medications - No data to display  Initial Impression / Assessment and Plan / UC Course  I have reviewed the triage vital signs and the nursing notes.  Pertinent labs & imaging results that were available during my care of the patient were reviewed by me and considered in my medical decision making (see chart for details).     Non pitting edema. Strain to ankles s/p fall vs dependent edema vs kidney or cardiac source of swelling dicussed and considered. Lungs clear. No redness or warmth. Ace wraps, elevation, nsaids recommended. Follow up with PCP for recheck of ankles, sports medicine as needed, as well as BP recheck. Likely needs DM management as well.  Return precautions provided. Patient verbalized understanding and agreeable to plan.  Ambulatory out of clinic without difficulty.    Final Clinical Impressions(s) / UC Diagnoses   Final diagnoses:  Bilateral ankle pain, unspecified chronicity     Discharge Instructions     Ice, elevation to help with pain. Use of ace wrap from toes up toward legs, especially if your legs are dependent or with activity.  Meloxicam daily to help with pain, take with food and don't take additional ibuprofen.  Please  follow up with your primary care provider for recheck of your diabetes and blood pressure, as well as for referral as needed.  See exercises provided for strengthening of ankles.    ED Prescriptions    Medication Sig Dispense Auth. Provider   meloxicam (MOBIC) 7.5 MG tablet Take 1 tablet (7.5 mg total) by mouth daily. 20 tablet Zigmund Gottron, NP     PDMP not reviewed this encounter.   Zigmund Gottron, NP 08/28/19 [phone]

## 2019-08-28 NOTE — Discharge Instructions (Signed)
Ice, elevation to help with pain. Use of ace wrap from toes up toward legs, especially if your legs are dependent or with activity.  Meloxicam daily to help with pain, take with food and don't take additional ibuprofen.  Please follow up with your primary care provider for recheck of your diabetes and blood pressure, as well as for referral as needed.  See exercises provided for strengthening of ankles.

## 2019-08-28 NOTE — ED Triage Notes (Signed)
Pt states she has fell thru a porch and she went to the ER on the day it happened. Pt states she still has pain in both ankles. Pt states she needs a work note.

## 2019-08-29 ENCOUNTER — Encounter: Payer: Self-pay | Admitting: Family Medicine

## 2019-08-29 ENCOUNTER — Ambulatory Visit (INDEPENDENT_AMBULATORY_CARE_PROVIDER_SITE_OTHER): Payer: Self-pay | Admitting: Family Medicine

## 2019-08-29 ENCOUNTER — Ambulatory Visit: Payer: Self-pay

## 2019-08-29 VITALS — BP 128/92 | Ht 62.0 in

## 2019-08-29 DIAGNOSIS — M25571 Pain in right ankle and joints of right foot: Secondary | ICD-10-CM

## 2019-08-29 NOTE — Progress Notes (Signed)
Subjective:    Patient ID: Paula Bates, female    DOB: [birth year], 43 y.o.   MRN: [id number]  HPI 43 year old female who presents with bilateral ankle pain.  Patient describes the pain as a pins-and-needles and intermittently throbbing sensation in her bilateral lower extremities.  Patient has also had accompanying swelling of her bilateral feet and ankles.  The swelling has been there for "quite a while".  The patient was first seen for this issue on 06/19/2019 emergency department after she fell through a 3 or 4 foot high porch.  She states that she inverted her right ankle at that time and that was bothering her a little more than the left.  She was seen at urgent care on 08/28/2019 because the pain had gotten up to a 7 out of 10 in her bilateral lower extremities.  Patient says that she is a little stiff in the morning her ankles, and "takes some time to shake it out".  Of note the patient is supposed to be on metformin and insulin.  States that she is not taken out of medications and around 13 years.  She had an A1c of around 12 at the check back in 2019.  She has been applying heating pads to her feet and states that she is getting some relief from these.  Review of Systems Per HPI  Reviewed past surgical history, past medical history, social history, allergies    Objective:   Physical Exam General: Very pleasant 43 year old African-American female, no acute distress Cardiac: Skin warm and dry with no pitting edema noted above ankles Respiratory: Able speaking coherent sentences with no accessory muscle use  Right foot and ankle Inspection: Edema noted right ankle and foot, no edema noted above ankle Palpation: Tenderness palpation right fifth metatarsal, ATFL, peroneal tendons Range of motion: Range of motion full intact to foot inversion, eversion, flexion, tension Strength: 5/5 strength foot flexion, extension, inversion, eversion Stability: No instability noted Neurovascular:  Warm and dry, sensation intact all nerve distribution Special test: Negative talar tilt, negative anterior drawer  Left foot and ankle Inspection: Edema noted right ankle and foot, no edema noted left ankle Palpation: No tenderness palpation Range of motion: Range of motion full intact to foot inversion, eversion, flexion, tension Strength: 5/5 strength foot flexion, extension, inversion, eversion Stability: No instability noted Neurovascular: Warm and dry, sensation intact all nerve distribution Special test: Negative talar tilt, negative anterior drawer  Limited bilateral foot and ankle ultrasound Right ankle: No obvious deformity noted, no cortical irregularity of distal fibula or proximal 5th metatarsal.  No tearing noted peroneus brevis or longus.  No fluid accumulation noted in the tendon sheath.  ATFL intact.  No fluid accumulation within joint.  Left ankle: No effusion.    Assessment & Plan:  Assessment 43 year old female who presents for lateral foot ankle pain/swelling.  Patient's left ankle pain consistent with uncontrolled diabetic neuropathy.  Her swelling could be due to chronic venous insuffiency or perhaps her amlodipine.  Encouraged patient to follow-up with her primary care physician for better diabetes management as well as swelling evaluation.  Is not felt to be consistent with her ankle trauma.  Patient has clinical exam consistent with right peroneal tendon strain with resultant tendinopathy.  Can do peroneal eccentric training exercises to help strengthen these areas.  Can take Tylenol and ice area as needed to help with relief.  Plan Right ankle pain -Eccentric peroneal strengthening exercises -Tylenol and ice as needed for pain relief -Follow-up PRN -  Could consider physical therapy or custom orthotics if not improving with the strengthening exercises -Follow-up with primary care physician for swelling and diabetic management  Left ankle pain -Follow-up with  primary care physician to discuss swelling management and diabetic management-follow-up -Follow-up PRN  Guadalupe Dawn MD PGY-3 Family Medicine Resident

## 2019-08-29 NOTE — Patient Instructions (Signed)
You strained your peroneal tendons on the outside of your ankle. Ice (or heat if this feels better) 15 minutes at a time 3-4 times a day. Tylenol, aleve as needed. Do home exercises 3 sets of 10 once a day for next 6 weeks. Arch support is usually very helpful (something like spencos or superfeet). Consider physical therapy, custom orthotics if not improving. See your family doctor to discuss the swelling in your legs, tingling.

## 2019-08-30 ENCOUNTER — Encounter: Payer: Self-pay | Admitting: Family Medicine

## 2019-12-22 ENCOUNTER — Emergency Department (HOSPITAL_COMMUNITY)
Admission: EM | Admit: 2019-12-22 | Discharge: 2019-12-22 | Disposition: A | Discharge status: Home / Self Care | Payer: HRSA Program | Attending: Emergency Medicine | Admitting: Emergency Medicine

## 2019-12-22 ENCOUNTER — Other Ambulatory Visit: Payer: Self-pay

## 2019-12-22 ENCOUNTER — Encounter (HOSPITAL_COMMUNITY): Payer: Self-pay | Admitting: Emergency Medicine

## 2019-12-22 DIAGNOSIS — M7918 Myalgia, other site: Secondary | ICD-10-CM | POA: Diagnosis present

## 2019-12-22 DIAGNOSIS — B349 Viral infection, unspecified: Secondary | ICD-10-CM

## 2019-12-22 DIAGNOSIS — R05 Cough: Secondary | ICD-10-CM | POA: Diagnosis not present

## 2019-12-22 DIAGNOSIS — Z8632 Personal history of gestational diabetes: Secondary | ICD-10-CM | POA: Insufficient documentation

## 2019-12-22 DIAGNOSIS — F17210 Nicotine dependence, cigarettes, uncomplicated: Secondary | ICD-10-CM | POA: Diagnosis not present

## 2019-12-22 DIAGNOSIS — R519 Headache, unspecified: Secondary | ICD-10-CM | POA: Insufficient documentation

## 2019-12-22 DIAGNOSIS — Z20822 Contact with and (suspected) exposure to covid-19: Secondary | ICD-10-CM | POA: Insufficient documentation

## 2019-12-22 LAB — SARS CORONAVIRUS 2 (TAT 6-24 HRS): SARS Coronavirus 2: NEGATIVE

## 2019-12-22 NOTE — Discharge Instructions (Signed)
You may take Tylenol and ibuprofen as needed for pain and headaches.  Do not take ibuprofen if there is a chance she may be pregnant.  Do not take more than 4000 mg Tylenol or more than 2400 mg ibuprofen in a 24-hour period.  You may also take over-the-counter cough syrup as well as Mucinex.

## 2019-12-22 NOTE — ED Provider Notes (Signed)
[facility] EMERGENCY DEPARTMENT Provider Note   CSN: [id number] Arrival date & time: 12/22/19  [id number]    History Chief Complaint  Patient presents with  . COVID testing  . Generalized Body Aches  . Headache    Paula Bates is a 44 y.o. female DM who presents for evaluation of COVId exposure. Patient states has had generalized body aches, headache, congestion, rhinorrhea, nonproductive cough over the last 24 hours.  Patient states she was exposed to Covid 19 last week.  Denies fever, chills, nausea, vomiting, chest pain, shortness of breath, hemoptysis, abdominal pain, diarrhea, dysuria, unilateral weakness.  Patient states headache gradual nature.  Denies sudden onset thunderclap headache, occultly speaking, unnilateral weakness, paresthesias.  She has taken Tylenol with relief of her symptoms.  Patient is concerned as she has young children and she does not want to infect them if she is positive with Covid.  Rates current pain a 3/10.  Denies chance of pregnancy.  She is tolerating p.o. intake at home without difficulty.  Denies additional aggravating or alleviating factors.  History obtained from patient and past medical records.  No interpreter is used.  HPI     Past Medical History:  Diagnosis Date  . Bladder cancer ([place]) 12/2016  . Diabetes mellitus without complication (HCC)    gestational diabetes  . Herpes simplex infection    has never had outbreak, was tested in blood by family physician  . Hx of tonsillectomy    44 years old  . Infection    UTI  . Pregnancy induced hypertension   . Vaginal Pap smear, abnormal    cryotherapy, ok since    Patient Active Problem List   Diagnosis Date Noted  . Hypertension 12/15/2017  . S/P cesarean section 12/13/2017  . Breech presentation, no version   . Gestational hypertension 12/10/2017  . Vitamin D deficiency 10/19/2017  . Sickle cell trait ([place]) 10/13/2017  . 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 Gestational  diabetes 10/12/2017  . Supervision of high risk pregnancy, antepartum, third trimester 10/12/2017  . H/O pre-eclampsia in prior pregnancy, currently pregnant 10/12/2017  . H/O preterm delivery, currently pregnant 10/12/2017  . Herpes simplex infection   . Uterine leiomyoma 07/13/2017  . 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 Prolactinoma ([place]) 03/29/2014  . Obesity, unspecified 03/29/2014    Past Surgical History:  Procedure Laterality Date  . BLADDER SURGERY  01/2016  . CESAREAN SECTION N/A 12/13/2017   Procedure: CESAREAN SECTION;  Surgeon: Florian Buff, MD;  Location: [facility];  Service: Obstetrics;  Laterality: N/A;  . TONSILLECTOMY  1996     OB History    Gravida  4   Para  3   Term  2   Preterm  1   AB  1   Living  3     SAB  1   TAB      Ectopic      Multiple  0   Live Births  3           Family History  Problem Relation Age of Onset  . Diabetes Mother   . Hypertension Mother     Social History   Tobacco Use  . Smoking status: Former Smoker    Packs/day: 0.25    Types: Cigarettes    Quit date: 10/15/2010    Years since quitting: 9.1  . Smokeless tobacco: Never Used  Substance Use Topics  . Alcohol use: No    Alcohol/week: 0.0 standard drinks  Comment: occ  . Drug use: No    Home Medications Prior to Admission medications   Medication Sig Start Date End Date Taking? Authorizing Provider  amLODipine (NORVASC) 5 MG tablet Take 1 tablet (5 mg total) by mouth daily. Patient not taking: Reported on 03/03/2018 12/15/17 12/15/18  Larwance Rote, MD  meloxicam (MOBIC) 7.5 MG tablet Take 1 tablet (7.5 mg total) by mouth daily. 08/28/19   Zigmund Gottron, NP  metFORMIN (GLUCOPHAGE) 500 MG tablet Take 1 tablet (500 mg total) by mouth 2 (two) times daily with a meal. Patient not taking: Reported on 03/03/2018 12/15/17 12/15/18  Larwance Rote, MD  Prenatal Vit-Fe Fumarate-FA (PRENATAL MULTIVITAMIN) TABS tablet Take 1 tablet by mouth daily  at 12 noon.    [provider]  valACYclovir (VALTREX) 500 MG tablet Take 1 tablet (500 mg total) by mouth 2 (two) times daily. Patient not taking: Reported on 03/03/2018 11/23/17   Sloan Leiter, MD    Allergies    Patient has no known allergies.  Review of Systems   Review of Systems  Constitutional: Negative.   HENT: Positive for congestion, postnasal drip and rhinorrhea. Negative for drooling, ear discharge, facial swelling, hearing loss, mouth sores, sneezing, sore throat, trouble swallowing and voice change.   Eyes: Negative for photophobia and visual disturbance.  Respiratory: Positive for cough. Negative for apnea, choking, chest tightness, shortness of breath, wheezing and stridor.   Cardiovascular: Negative.   Genitourinary: Negative.   Musculoskeletal: Negative.   Neurological: Positive for dizziness. Negative for tremors, seizures, syncope, facial asymmetry, speech difficulty, weakness, light-headedness, numbness and headaches.  All other systems reviewed and are negative.   Physical Exam Updated Vital Signs BP (!) 153/103 (BP Location: Right Arm)   Pulse 94   Temp 98.1 F (36.7 C) (Oral)   Resp 16   LMP 11/14/2019   SpO2 100%   Physical Exam Vitals and nursing note reviewed.  Constitutional:      General: She is not in acute distress.    Appearance: She is not ill-appearing, toxic-appearing or diaphoretic.  HENT:     Head: Normocephalic and atraumatic.     Jaw: There is normal jaw occlusion.     Right Ear: Tympanic membrane, ear canal and external ear normal. There is no impacted cerumen. No hemotympanum. Tympanic membrane is not injected, scarred, perforated, erythematous, retracted or bulging.     Left Ear: Tympanic membrane, ear canal and external ear normal. There is no impacted cerumen. No hemotympanum. Tympanic membrane is not injected, scarred, perforated, erythematous, retracted or bulging.     Ears:     Comments: No Mastoid tenderness.     Nose:     Comments: Clear rhinorrhea and congestion to bilateral nares.  No sinus tenderness.    Mouth/Throat:     Comments: Posterior oropharynx clear.  Mucous membranes moist.  Tonsils without erythema or exudate.  Uvula midline without deviation.  No evidence of PTA or RPA.  No drooling, dysphasia or trismus.  Phonation normal. Eyes:     Comments: No horizontal, vertical or rotational nystagmus   Neck:     Trachea: Trachea and phonation normal.     Meningeal: Brudzinski's sign and Kernig's sign absent.     Comments: Full active and passive ROM without pain No midline or paraspinal tenderness No nuchal rigidity or meningeal signs  Cardiovascular:     Comments: No murmurs rubs or gallops. Pulmonary:     Comments: Clear to auscultation bilaterally without wheeze,  rhonchi or rales.  No accessory muscle usage.  Able speak in full sentences. Abdominal:     Comments: Soft, nontender without rebound or guarding.  No CVA tenderness.  Musculoskeletal:     Comments: Moves all 4 extremities without difficulty.  Lower extremities without edema, erythema or warmth.  Skin:    Comments: Brisk capillary refill.  No rashes or lesions.  Neurological:     General: No focal deficit present.     Mental Status: She is alert.     Cranial Nerves: Cranial nerves are intact.     Sensory: Sensation is intact.     Motor: Motor function is intact.     Coordination: Coordination is intact.     Gait: Gait is intact.     Comments: Mental Status:  Alert, oriented, thought content appropriate. Speech fluent without evidence of aphasia. Able to follow 2 step commands without difficulty.  Cranial Nerves:  II:  Peripheral visual fields grossly normal, pupils equal, round, reactive to light III,IV, VI: ptosis not present, extra-ocular motions intact bilaterally  V,VII: smile symmetric, facial light touch sensation equal VIII: hearing grossly normal bilaterally  IX,X: midline uvula rise  XI: bilateral shoulder  shrug equal and strong XII: midline tongue extension  Motor:  5/5 in upper and lower extremities bilaterally including strong and equal grip strength and dorsiflexion/plantar flexion Sensory: Pinprick and light touch normal in all extremities.  Deep Tendon Reflexes: 2+ and symmetric  Cerebellar: normal finger-to-nose with bilateral upper extremities Gait: normal gait and balance CV: distal pulses palpable throughout       ED Results / Procedures / Treatments   Labs (all labs ordered are listed, but only abnormal results are displayed) Labs Reviewed  SARS CORONAVIRUS 2 (TAT 6-24 HRS)    EKG None  Radiology No results found.  Procedures Procedures (including critical care time)  Medications Ordered in ED Medications - No data to display  ED Course  I have reviewed the triage vital signs and the nursing notes.  Pertinent labs & imaging results that were available during my care of the patient were reviewed by me and considered in my medical decision making (see chart for details).  44 year old female appears otherwise well presents for evaluation of needing Covid test.  Patient with exposure approximately 1 week ago.  Patient with upper respiratory symptoms.  She has had a mild headache however denies sudden onset thunderclap headache.  She has a nonfocal neuro exam without deficits.  Tolerating p.o. intake at home without difficulty.  Heart and lungs clear.  Abdomen soft.  Ambulatory with out difficulty. Presentation non concerning for [facility], ICH, CVA, dissection, Meningitis, or temporal arteritis. Pt is afebrile with no focal neuro deficits, nuchal rigidity, or change in vision.  Patient likely with viral illness. Low suspicion for DKA, acute bacterial infections process. She is afebrile without tachycardia, tachypnea or hypoxia.  Discussed symptomatic management at home.  Discussed return precautions.  Patient voiced understanding and is agreeable follow-up.  The patient has been  appropriately medically screened and/or stabilized in the ED. I have low suspicion for any other emergent medical condition which would require further screening, evaluation or treatment in the ED or require inpatient management.  Patient is hemodynamically stable and in no acute distress.  Patient able to ambulate in department prior to ED.  Evaluation does not show acute pathology that would require ongoing or additional emergent interventions while in the emergency department or further inpatient treatment.  I have discussed the diagnosis with  the patient and answered all questions.  Pain is been managed while in the emergency department and patient has no further complaints prior to discharge.  Patient is comfortable with plan discussed in room and is stable for discharge at this time.  I have discussed strict return precautions for returning to the emergency department.  Patient was encouraged to follow-up with PCP/specialist refer to at discharge.   MDM Rules/Calculators/A&P                      Paula Bates was evaluated in Emergency Department on 12/22/2019 for the symptoms described in the history of present illness. She was evaluated in the context of the global COVID-19 pandemic, which necessitated consideration that the patient might be at risk for infection with the SARS-CoV-2 virus that causes COVID-19. Institutional protocols and algorithms that pertain to the evaluation of patients at risk for COVID-19 are in a state of rapid change based on information released by regulatory bodies including the CDC and federal and state organizations. These policies and algorithms were followed during the patient's care in the ED. Final Clinical Impression(s) / ED Diagnoses Final diagnoses:  Viral illness  COVID-19 virus test result unknown    Rx / DC Orders ED Discharge Orders    None       Stedman [place] A, PA-C 12/22/19 0932    Maudie Flakes, MD 12/26/19 1513

## 2019-12-22 NOTE — ED Triage Notes (Signed)
C/o generalized body aches and headache since yesterday.  States she was exposed to a COVID + person at [facility] last week.

## 2020-02-13 ENCOUNTER — Ambulatory Visit (HOSPITAL_COMMUNITY)
Admission: EM | Admit: 2020-02-13 | Discharge: 2020-02-13 | Disposition: A | Discharge status: Home / Self Care | Payer: HRSA Program | Attending: Physician Assistant | Admitting: Physician Assistant

## 2020-02-13 ENCOUNTER — Other Ambulatory Visit: Payer: Self-pay

## 2020-02-13 DIAGNOSIS — Z20822 Contact with and (suspected) exposure to covid-19: Secondary | ICD-10-CM | POA: Diagnosis present

## 2020-02-13 DIAGNOSIS — L209 Atopic dermatitis, unspecified: Secondary | ICD-10-CM | POA: Insufficient documentation

## 2020-02-13 DIAGNOSIS — B349 Viral infection, unspecified: Secondary | ICD-10-CM | POA: Diagnosis not present

## 2020-02-13 DIAGNOSIS — M791 Myalgia, unspecified site: Secondary | ICD-10-CM

## 2020-02-13 DIAGNOSIS — R519 Headache, unspecified: Secondary | ICD-10-CM

## 2020-02-13 MED ORDER — ONDANSETRON HCL 4 MG PO TABS: 4.0000 mg | ORAL_TABLET | Freq: Three times a day (TID) | ORAL | 0 refills | Status: AC | PRN

## 2020-02-13 NOTE — ED Triage Notes (Signed)
Patient reports she was exposed to a positive COVID approx 1.5 weeks ago, patient started having headache, fatigue, and rash on Thursday. Patient has been taken OTC meds to help with symptoms.

## 2020-02-13 NOTE — ED Provider Notes (Signed)
[place]    CSN: [id number] Arrival date & time: 02/13/20  1041      History   Chief Complaint Chief Complaint  Patient presents with  . Headache    *POSITIVE COVID EXPOSURE*  . Fatigue    HPI Paula Bates is a 44 y.o. female.   Patient reports urgent care today for generalized body aches, headache after a positive Covid exposure 7 to 10 days ago.  She reports her headache is primarily frontal.  She denies fever but reports body ache and chills.  She also endorses fatigue.  She has also had nausea on and off however has been able to eat and drink.  He denies vomiting.  She also endorses a few episodes of loose stool.  She is also had some nasal congestion.  Denies sore throat or ear pain.  Denies she denies significant cough or shortness of breath.  Denies abdominal pain.  She also presents with a rash on her chest.  The rash is itchy.  She denies that it is grown in size.  She does have a history of eczema has applied hydrocortisone and eczema creams to this.  She reports this is helped it.    She has been taking Tylenol.     Past Medical History:  Diagnosis Date  . Bladder cancer ([place]) 12/2016  . Diabetes mellitus without complication (HCC)    gestational diabetes  . Herpes simplex infection    has never had outbreak, was tested in blood by family physician  . Hx of tonsillectomy    44 years old  . Infection    UTI  . Pregnancy induced hypertension   . Vaginal Pap smear, abnormal    cryotherapy, ok since    Patient Active Problem List   Diagnosis Date Noted  . Hypertension 12/15/2017  . S/P cesarean section 12/13/2017  . Breech presentation, no version   . Gestational hypertension 12/10/2017  . Vitamin D deficiency 10/19/2017  . Sickle cell trait ([place]) 10/13/2017  . 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 Gestational diabetes 10/12/2017  . Supervision of high risk pregnancy, antepartum, third trimester 10/12/2017  . H/O pre-eclampsia in prior pregnancy,  currently pregnant 10/12/2017  . H/O preterm delivery, currently pregnant 10/12/2017  . Herpes simplex infection   . Uterine leiomyoma 07/13/2017  . 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 Prolactinoma ([place]) 03/29/2014  . Obesity, unspecified 03/29/2014    Past Surgical History:  Procedure Laterality Date  . BLADDER SURGERY  01/2016  . CESAREAN SECTION N/A 12/13/2017   Procedure: CESAREAN SECTION;  Surgeon: Florian Buff, MD;  Location: [facility];  Service: Obstetrics;  Laterality: N/A;  . TONSILLECTOMY  1996    OB History    Gravida  4   Para  3   Term  2   Preterm  1   AB  1   Living  3     SAB  1   TAB      Ectopic      Multiple  0   Live Births  3            Home Medications    Prior to Admission medications   Medication Sig Start Date End Date Taking? Authorizing Provider  amLODipine (NORVASC) 5 MG tablet Take 1 tablet (5 mg total) by mouth daily. Patient not taking: Reported on 03/03/2018 12/15/17 12/15/18  Larwance Rote, MD  meloxicam (MOBIC) 7.5 MG tablet Take 1 tablet (7.5 mg total) by mouth daily.  08/28/19   Zigmund Gottron, NP  metFORMIN (GLUCOPHAGE) 500 MG tablet Take 1 tablet (500 mg total) by mouth 2 (two) times daily with a meal. Patient not taking: Reported on 03/03/2018 12/15/17 12/15/18  Larwance Rote, MD  ondansetron (ZOFRAN) 4 MG tablet Take 1 tablet (4 mg total) by mouth every 8 (eight) hours as needed for nausea or vomiting. 02/13/20   Evey Mcmahan, Marguerita Beards, PA-C  Prenatal Vit-Fe Fumarate-FA (PRENATAL MULTIVITAMIN) TABS tablet Take 1 tablet by mouth daily at 12 noon.    [provider]  valACYclovir (VALTREX) 500 MG tablet Take 1 tablet (500 mg total) by mouth 2 (two) times daily. Patient not taking: Reported on 03/03/2018 11/23/17   Sloan Leiter, MD    Family History Family History  Problem Relation Age of Onset  . Diabetes Mother   . Hypertension Mother     Social History Social History   Tobacco Use  . Smoking  status: Former Smoker    Packs/day: 0.25    Types: Cigarettes    Quit date: 10/15/2010    Years since quitting: 9.3  . Smokeless tobacco: Never Used  Substance Use Topics  . Alcohol use: No    Alcohol/week: 0.0 standard drinks    Comment: occ  . Drug use: No     Allergies   Patient has no known allergies.   Review of Systems Review of Systems  Constitutional: Positive for chills and fatigue. Negative for fever.  HENT: Positive for congestion. Negative for ear pain, rhinorrhea, sinus pressure, sinus pain and sore throat.   Eyes: Negative for pain and visual disturbance.  Respiratory: Positive for cough. Negative for shortness of breath.   Cardiovascular: Negative for chest pain and palpitations.  Gastrointestinal: Positive for diarrhea and nausea. Negative for abdominal pain and vomiting.  Genitourinary: Negative for dysuria and hematuria.  Musculoskeletal: Positive for myalgias. Negative for arthralgias and back pain.  Skin: Negative for color change and rash.  Neurological: Positive for headaches. Negative for dizziness, seizures and syncope.  All other systems reviewed and are negative.    Physical Exam Triage Vital Signs ED Triage Vitals [02/13/20 1148]  Enc Vitals Group     BP 134/78     Pulse Rate (!) 2     Resp 17     Temp 98.4 F (36.9 C)     Temp Source Oral     SpO2 98 %     Weight      Height      Head Circumference      Peak Flow      Pain Score 0     Pain Loc      Pain Edu?      Excl. in [place]?    No data found.  Updated Vital Signs BP 134/78 (BP Location: Right Arm)   Pulse 92   Temp 98.4 F (36.9 C) (Oral)   Resp 17   SpO2 98%   Visual Acuity Right Eye Distance:   Left Eye Distance:   Bilateral Distance:    Right Eye Near:   Left Eye Near:    Bilateral Near:     Physical Exam Vitals and nursing note reviewed.  Constitutional:      General: She is not in acute distress.    Appearance: She is well-developed. She is ill-appearing.   HENT:     Head: Normocephalic and atraumatic.     Mouth/Throat:     Mouth: Mucous membranes are moist.  Pharynx: Oropharynx is clear.  Eyes:     Extraocular Movements: Extraocular movements intact.     Conjunctiva/sclera: Conjunctivae normal.     Pupils: Pupils are equal, round, and reactive to light.  Cardiovascular:     Rate and Rhythm: Normal rate and regular rhythm.     Heart sounds: No murmur.  Pulmonary:     [place]: Pulmonary effort is normal. No respiratory distress.     Breath sounds: Normal breath sounds. No wheezing, rhonchi or rales.  Abdominal:     Palpations: Abdomen is soft.     Tenderness: There is no abdominal tenderness.  Musculoskeletal:        General: No swelling. Normal range of motion.     Cervical back: Normal range of motion and neck supple.  Skin:    General: Skin is warm and dry.     Comments: Eczematous rash on right upper chest.  Neurological:     Mental Status: She is alert and oriented to person, place, and time.     Cranial Nerves: No cranial nerve deficit.  Psychiatric:        Mood and Affect: Mood normal.        Speech: Speech normal.        Behavior: Behavior normal.      UC Treatments / Results  Labs (all labs ordered are listed, but only abnormal results are displayed) Labs Reviewed  NOVEL CORONAVIRUS, NAA (HOSP ORDER, SEND-OUT TO REF LAB; TAT 18-24 HRS)    EKG   Radiology No results found.  Procedures Procedures (including critical care time)  Medications Ordered in UC Medications - No data to display  Initial Impression / Assessment and Plan / UC Course  I have reviewed the triage vital signs and the nursing notes.  Pertinent labs & imaging results that were available during my care of the patient were reviewed by me and considered in my medical decision making (see chart for details).    #Viral illness #Eczema Patient 44 year old female presenting with viral syndrome consistent with both upper respiratory and  gastrointestinal disturbances.  This could be consistent with COVID-19.  Covid PCR was sent.  Patient is hemodynamically stable and has reassuring exams.  Skin lesion appears consistent with eczema.  Discussed symptomatic treatment with Tylenol for headache and Zofran for any developing nausea.  Discussed that she should utilize her skin lotions and hydrocortisone on her chest rash as she would for eczema in her other locations. -Increase p.o. intake to include electrolytes  Final Clinical Impressions(s) / UC Diagnoses   Final diagnoses:  Viral illness  Encounter for laboratory testing for COVID-19 virus  Atopic dermatitis, unspecified type     Discharge Instructions     I believe your skin rashes related to your eczema.  I want you to continue your typical eczema treatments for this.  If it is not improving or worsens please follow-up with your primary care.  Utilize the Zofran if you continue to feel nauseous.  Drink plenty of water and include an electrolyte beverage such as Gatorade or Pedialyte.  Eat small meals.  Continue Tylenol for your body ache and headache  If your Covid-19 test is positive, you will receive a phone call from [facility] regarding your results. Negative test results are not called. Both positive and negative results area always visible on MyChart. If you do not have a MyChart account, sign up instructions are in your discharge papers.   Persons who are directed to care for themselves  at home may discontinue isolation under the following conditions:  . At least 10 days have passed since symptom onset and . At least 24 hours have passed without running a fever (this means without the use of fever-reducing medications) and . Other symptoms have improved.  Persons infected with COVID-19 who never develop symptoms may discontinue isolation and other precautions 10 days after the date of their first positive COVID-19 test.     ED Prescriptions     Medication Sig Dispense Auth. Provider   ondansetron (ZOFRAN) 4 MG tablet Take 1 tablet (4 mg total) by mouth every 8 (eight) hours as needed for nausea or vomiting. 4 tablet Kaius Daino, Marguerita Beards, PA-C     PDMP not reviewed this encounter.   Purnell Shoemaker, PA-C 02/13/20 Bosie Helper

## 2020-02-13 NOTE — Discharge Instructions (Addendum)
I believe your skin rashes related to your eczema.  I want you to continue your typical eczema treatments for this.  If it is not improving or worsens please follow-up with your primary care.  Utilize the Zofran if you continue to feel nauseous.  Drink plenty of water and include an electrolyte beverage such as Gatorade or Pedialyte.  Eat small meals.  Continue Tylenol for your body ache and headache  If your Covid-19 test is positive, you will receive a phone call from [facility] regarding your results. Negative test results are not called. Both positive and negative results area always visible on MyChart. If you do not have a MyChart account, sign up instructions are in your discharge papers.   Persons who are directed to care for themselves at home may discontinue isolation under the following conditions:   At least 10 days have passed since symptom onset and  At least 24 hours have passed without running a fever (this means without the use of fever-reducing medications) and  Other symptoms have improved.  Persons infected with COVID-19 who never develop symptoms may discontinue isolation and other precautions 10 days after the date of their first positive COVID-19 test.

## 2020-02-15 LAB — NOVEL CORONAVIRUS, NAA (HOSP ORDER, SEND-OUT TO REF LAB; TAT 18-24 HRS): SARS-CoV-2, NAA: NOT DETECTED

## 2020-04-02 ENCOUNTER — Other Ambulatory Visit: Payer: Self-pay

## 2020-04-02 ENCOUNTER — Emergency Department (HOSPITAL_COMMUNITY)
Admission: EM | Admit: 2020-04-02 | Discharge: 2020-04-03 | Discharge status: Against Medical Advice | Payer: Self-pay | Attending: Emergency Medicine | Admitting: Emergency Medicine

## 2020-04-02 ENCOUNTER — Encounter (HOSPITAL_COMMUNITY): Payer: Self-pay | Admitting: *Deleted

## 2020-04-02 ENCOUNTER — Emergency Department (HOSPITAL_COMMUNITY): Payer: Self-pay

## 2020-04-02 DIAGNOSIS — R0789 Other chest pain: Secondary | ICD-10-CM | POA: Insufficient documentation

## 2020-04-02 DIAGNOSIS — Z5321 Procedure and treatment not carried out due to patient leaving prior to being seen by health care provider: Secondary | ICD-10-CM | POA: Insufficient documentation

## 2020-04-02 MED ORDER — SODIUM CHLORIDE 0.9% FLUSH: 3.0000 mL | Freq: Once | INTRAVENOUS | Status: DC

## 2020-04-02 NOTE — ED Triage Notes (Signed)
Pt reports several days of right side chest pain that radiates into her shoulder, neck and breast. Has mild sob associated with the pain. No acute distress noted at this time. Airway intact.

## 2020-04-03 LAB — TROPONIN I (HIGH SENSITIVITY)
Troponin I (High Sensitivity): <2 ng/L (ref <18)
Troponin I (High Sensitivity): 3 ng/L (ref <18)

## 2020-04-03 LAB — CBC
HCT: 38.6 % (ref 36.0–46.0)
Hemoglobin: 12.5 g/dL (ref 12.0–15.0)
MCH: 26.1 pg (ref 26.0–34.0)
MCHC: 32.4 g/dL (ref 30.0–36.0)
MCV: 80.6 fL (ref 80.0–100.0)
Platelets: 215 10*3/uL (ref 150–400)
RBC: 4.79 MIL/uL (ref 3.87–5.11)
RDW: 13.5 % (ref 11.5–15.5)
WBC: 5.9 10*3/uL (ref 4.0–10.5)
nRBC: 0 % (ref 0.0–0.2)

## 2020-04-03 LAB — BASIC METABOLIC PANEL
Anion gap: 9 (ref 5–15)
BUN: 8 mg/dL (ref 6–20)
CO2: 24 mmol/L (ref 22–32)
Calcium: 8.8 mg/dL — ABNORMAL LOW (ref 8.9–10.3)
Chloride: 99 mmol/L (ref 98–111)
Creatinine, Ser: 0.83 mg/dL (ref 0.44–1.00)
GFR calc Af Amer: >60 mL/min (ref >60)
GFR calc non Af Amer: >60 mL/min (ref >60)
Glucose, Bld: 331 mg/dL — ABNORMAL HIGH (ref 70–99)
Potassium: 3.7 mmol/L (ref 3.5–5.1)
Sodium: 132 mmol/L — ABNORMAL LOW (ref 135–145)

## 2020-04-03 LAB — I-STAT BETA HCG BLOOD, ED (MC, WL, AP ONLY): I-stat hCG, quantitative: <5 m[IU]/mL (ref <5)

## 2020-04-05 ENCOUNTER — Ambulatory Visit (HOSPITAL_COMMUNITY)
Admission: EM | Admit: 2020-04-05 | Discharge: 2020-04-05 | Disposition: A | Discharge status: Home / Self Care | Payer: Self-pay

## 2020-04-05 ENCOUNTER — Encounter (HOSPITAL_COMMUNITY): Payer: Self-pay

## 2020-04-05 DIAGNOSIS — R079 Chest pain, unspecified: Secondary | ICD-10-CM

## 2020-04-05 DIAGNOSIS — M25511 Pain in right shoulder: Secondary | ICD-10-CM

## 2020-04-05 NOTE — Discharge Instructions (Addendum)
Do not believe your pain is heart related. Your EKG, blood work and chest x-ray were all normal from here in the ER. I believe this is musculoskeletal and nerve pain We will have you keep doing the ibuprofen 600 mg every 8 hours. Rest, stretch the arm and chest area frequently. You can try doing some heat to the area Make sure you are watching your blood sugars and taking your Metformin as prescribed If your symptoms worsen to include worsening pain, nausea, vomiting, sweating you need to go to the ER

## 2020-04-05 NOTE — ED Notes (Signed)
Pt is on the phone when I tried to do the EKG, I explained I need to do the EKG first and then she can answer the phone, pt did not agree.

## 2020-04-05 NOTE — ED Triage Notes (Signed)
Pt reports having intermittent  shooting and throbbing pain in her chest that radiates into her right shoulder, right arm and right breast.

## 2020-04-09 NOTE — ED Provider Notes (Signed)
[place]    CSN: [id number] Arrival date & time: 04/05/20  [id number]      History   Chief Complaint Chief Complaint  Patient presents with  . Chest Injury  . Arm Pain    HPI Paula Bates is a 44 y.o. female.   Patient is a 44 year old female with no significant past medical history.  She presents today with intermittent, sharp shooting, throbbing right-sided chest pain that radiates into her right shoulder, right arm and right breast.  This has been ongoing, intermittent over the past month.  She is not take anything for her symptoms.  Denies any specific injuries but did recently start working out again.  This seemed to start after she started working out again.  No cough, chest congestion or fevers.  No shortness of breath.  ROS per HPI      Past Medical History:  Diagnosis Date  . Bladder cancer ([place]) 12/2016  . Diabetes mellitus without complication (HCC)    gestational diabetes  . Herpes simplex infection    has never had outbreak, was tested in blood by family physician  . Hx of tonsillectomy    44 years old  . Infection    UTI  . Pregnancy induced hypertension   . Vaginal Pap smear, abnormal    cryotherapy, ok since    Patient Active Problem List   Diagnosis Date Noted  . Hypertension 12/15/2017  . S/P cesarean section 12/13/2017  . Breech presentation, no version   . Gestational hypertension 12/10/2017  . Vitamin D deficiency 10/19/2017  . Sickle cell trait ([place]) 10/13/2017  . 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 Gestational diabetes 10/12/2017  . Supervision of high risk pregnancy, antepartum, third trimester 10/12/2017  . H/O pre-eclampsia in prior pregnancy, currently pregnant 10/12/2017  . H/O preterm delivery, currently pregnant 10/12/2017  . Herpes simplex infection   . Uterine leiomyoma 07/13/2017  . 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 Prolactinoma ([place]) 03/29/2014  . Obesity, unspecified 03/29/2014    Past Surgical History:    Procedure Laterality Date  . BLADDER SURGERY  01/2016  . CESAREAN SECTION N/A 12/13/2017   Procedure: CESAREAN SECTION;  Surgeon: Florian Buff, MD;  Location: [facility];  Service: Obstetrics;  Laterality: N/A;  . TONSILLECTOMY  1996    OB History    Gravida  4   Para  3   Term  2   Preterm  1   AB  1   Living  3     SAB  1   TAB      Ectopic      Multiple  0   Live Births  3            Home Medications    Prior to Admission medications   Medication Sig Start Date End Date Taking? Authorizing Provider  aspirin 325 MG tablet Take 325 mg by mouth daily.   Yes [provider]  amLODipine (NORVASC) 5 MG tablet Take 1 tablet (5 mg total) by mouth daily. Patient not taking: Reported on 03/03/2018 12/15/17 12/15/18  Larwance Rote, MD  meloxicam (MOBIC) 7.5 MG tablet Take 1 tablet (7.5 mg total) by mouth daily. 08/28/19   Zigmund Gottron, NP  metFORMIN (GLUCOPHAGE) 500 MG tablet Take 1 tablet (500 mg total) by mouth 2 (two) times daily with a meal. 12/15/17 12/15/18  Larwance Rote, MD  ondansetron (ZOFRAN) 4 MG tablet Take 1 tablet (4 mg total) by mouth every 8 (  eight) hours as needed for nausea or vomiting. 02/13/20   Darr, Marguerita Beards, PA-C  Prenatal Vit-Fe Fumarate-FA (PRENATAL MULTIVITAMIN) TABS tablet Take 1 tablet by mouth daily at 12 noon.    [provider]  valACYclovir (VALTREX) 500 MG tablet Take 1 tablet (500 mg total) by mouth 2 (two) times daily. Patient not taking: Reported on 03/03/2018 11/23/17   Sloan Leiter, MD    Family History Family History  Problem Relation Age of Onset  . Diabetes Mother   . Hypertension Mother     Social History Social History   Tobacco Use  . Smoking status: Former Smoker    Packs/day: 0.25    Types: Cigarettes    Quit date: 10/15/2010    Years since quitting: 9.4  . Smokeless tobacco: Never Used  Substance Use Topics  . Alcohol use: No    Alcohol/week: 0.0 standard drinks    Comment: occ  .  Drug use: No     Allergies   Patient has no known allergies.   Review of Systems Review of Systems   Physical Exam Triage Vital Signs ED Triage Vitals  Enc Vitals Group     BP 04/05/20 0852 (!) 142/99     Pulse Rate 04/05/20 0852 70     Resp 04/05/20 0852 18     Temp 04/05/20 0852 99.5 F (37.5 C)     Temp Source 04/05/20 0852 Oral     SpO2 04/05/20 0852 100 %     Weight --      Height --      Head Circumference --      Peak Flow --      Pain Score 04/05/20 0851 6     Pain Loc --      Pain Edu? --      Excl. in [place]? --    No data found.  Updated Vital Signs BP (!) 142/99 (BP Location: Left Arm)   Pulse 70   Temp 99.5 F (37.5 C) (Oral)   Resp 18   LMP 03/08/2020   SpO2 100%   Visual Acuity Right Eye Distance:   Left Eye Distance:   Bilateral Distance:    Right Eye Near:   Left Eye Near:    Bilateral Near:     Physical Exam Vitals and nursing note reviewed.  Constitutional:      General: She is not in acute distress.    Appearance: Normal appearance. She is not ill-appearing, toxic-appearing or diaphoretic.  HENT:     Head: Normocephalic.     Nose: Nose normal.     Mouth/Throat:     Pharynx: Oropharynx is clear.  Eyes:     Conjunctiva/sclera: Conjunctivae normal.  Cardiovascular:     Rate and Rhythm: Normal rate and regular rhythm.  Pulmonary:     [place]: Pulmonary effort is normal.     Breath sounds: Normal breath sounds.  Musculoskeletal:        General: Normal range of motion.     Cervical back: Normal range of motion.     Comments: Tender with range of motion exercises of the right shoulder.  Limited range of motion. No swelling to the arm, strength equal bilateral Radial pulse strong No specific pain with palpation of shoulder, arm or back  Skin:    General: Skin is warm and dry.     Findings: No rash.  Neurological:     Mental Status: She is alert.  Psychiatric:  Mood and Affect: Mood normal.      UC Treatments / Results   Labs (all labs ordered are listed, but only abnormal results are displayed) Labs Reviewed - No data to display  EKG   Radiology No results found.  Procedures Procedures (including critical care time)  Medications Ordered in UC Medications - No data to display  Initial Impression / Assessment and Plan / UC Course  I have reviewed the triage vital signs and the nursing notes.  Pertinent labs & imaging results that were available during my care of the patient were reviewed by me and considered in my medical decision making (see chart for details).     Chest pain, arm pain -most likely musculoskeletal based on exam Patient was in the ER prior to coming here and all labs, EKG and chest x-ray reviewed. All results were unremarkable.  No concern for ACS at this time. Recommended 600 mg ibuprofen every 6 hours for pain and inflammation.  Recommended heat, gentle stretching and massage. Return and ER precautions given   Final Clinical Impressions(s) / UC Diagnoses   Final diagnoses:  Acute pain of right shoulder  Chest pain, unspecified type     Discharge Instructions     Do not believe your pain is heart related. Your EKG, blood work and chest x-ray were all normal from here in the ER. I believe this is musculoskeletal and nerve pain We will have you keep doing the ibuprofen 600 mg every 8 hours. Rest, stretch the arm and chest area frequently. You can try doing some heat to the area Make sure you are watching your blood sugars and taking your Metformin as prescribed If your symptoms worsen to include worsening pain, nausea, vomiting, sweating you need to go to the ER     ED Prescriptions    None     PDMP not reviewed this encounter.   Orvan July, NP 04/09/20 1904

## 2020-05-20 ENCOUNTER — Other Ambulatory Visit: Payer: Self-pay

## 2020-05-20 ENCOUNTER — Emergency Department (HOSPITAL_COMMUNITY)
Admission: EM | Admit: 2020-05-20 | Discharge: 2020-05-20 | Disposition: A | Discharge status: Home / Self Care | Payer: Medicaid Other | Attending: Emergency Medicine | Admitting: Emergency Medicine

## 2020-05-20 ENCOUNTER — Encounter (HOSPITAL_COMMUNITY): Payer: Self-pay | Admitting: Emergency Medicine

## 2020-05-20 DIAGNOSIS — Y92410 Unspecified street and highway as the place of occurrence of the external cause: Secondary | ICD-10-CM | POA: Insufficient documentation

## 2020-05-20 DIAGNOSIS — Y999 Unspecified external cause status: Secondary | ICD-10-CM | POA: Insufficient documentation

## 2020-05-20 DIAGNOSIS — Y9389 Activity, other specified: Secondary | ICD-10-CM | POA: Insufficient documentation

## 2020-05-20 DIAGNOSIS — S46912A Strain of unspecified muscle, fascia and tendon at shoulder and upper arm level, left arm, initial encounter: Secondary | ICD-10-CM | POA: Insufficient documentation

## 2020-05-20 DIAGNOSIS — T148XXA Other injury of unspecified body region, initial encounter: Secondary | ICD-10-CM

## 2020-05-20 DIAGNOSIS — M79602 Pain in left arm: Secondary | ICD-10-CM | POA: Insufficient documentation

## 2020-05-20 MED ORDER — IBUPROFEN 800 MG PO TABS
800.0000 mg | ORAL_TABLET | Freq: Once | ORAL | Status: AC
  Administered 2020-05-20: 800 mg via ORAL
  Filled 2020-05-20: qty 1

## 2020-05-20 MED ORDER — CYCLOBENZAPRINE HCL 10 MG PO TABS: 10.0000 mg | ORAL_TABLET | Freq: Two times a day (BID) | ORAL | 0 refills | Status: DC | PRN
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
## 2020-05-20 NOTE — Discharge Instructions (Addendum)
Please use warm and cold therapy, muscle relaxants, and gentle movement for pain of strained muscles. Return if you have worsening symptoms or new symptoms

## 2020-09-13 ENCOUNTER — Other Ambulatory Visit: Payer: Self-pay

## 2020-09-13 ENCOUNTER — Encounter (HOSPITAL_COMMUNITY): Payer: Self-pay

## 2020-09-13 ENCOUNTER — Ambulatory Visit (HOSPITAL_COMMUNITY)
Admission: EM | Admit: 2020-09-13 | Discharge: 2020-09-13 | Disposition: A | Discharge status: Home / Self Care | Payer: Medicaid Other | Attending: Internal Medicine | Admitting: Internal Medicine

## 2020-09-13 DIAGNOSIS — R21 Rash and other nonspecific skin eruption: Secondary | ICD-10-CM | POA: Diagnosis present

## 2020-09-13 DIAGNOSIS — Z87891 Personal history of nicotine dependence: Secondary | ICD-10-CM | POA: Insufficient documentation

## 2020-09-13 DIAGNOSIS — Z791 Long term (current) use of non-steroidal anti-inflammatories (NSAID): Secondary | ICD-10-CM | POA: Diagnosis not present

## 2020-09-13 DIAGNOSIS — U071 COVID-19: Secondary | ICD-10-CM | POA: Diagnosis not present

## 2020-09-13 DIAGNOSIS — D573 Sickle-cell trait: Secondary | ICD-10-CM | POA: Diagnosis not present

## 2020-09-13 DIAGNOSIS — E559 Vitamin D deficiency, unspecified: Secondary | ICD-10-CM | POA: Insufficient documentation

## 2020-09-13 DIAGNOSIS — Z7982 Long term (current) use of aspirin: Secondary | ICD-10-CM | POA: Insufficient documentation

## 2020-09-13 DIAGNOSIS — B349 Viral infection, unspecified: Secondary | ICD-10-CM

## 2020-09-13 LAB — SARS CORONAVIRUS 2 (TAT 6-24 HRS): SARS Coronavirus 2: POSITIVE — AB

## 2020-09-13 MED ORDER — TRIAMCINOLONE ACETONIDE 0.1 % EX CREA: 1.0000 "application " | TOPICAL_CREAM | Freq: Two times a day (BID) | CUTANEOUS | 0 refills | Status: AC

## 2020-09-13 NOTE — Discharge Instructions (Addendum)
We have tested you for COVID  Go home and quarantine until we get the  results.  You can take OTC medications as needed.  Triamcinolone cream for the rash. Make sure you keep your skin very moisturized with Eucerin cream for eczema. Follow up as needed for continued or worsening symptoms

## 2020-09-13 NOTE — ED Provider Notes (Signed)
[facility]    CSN: [id number] Arrival date & time: 09/13/20  6237      History   Chief Complaint Chief Complaint  Patient presents with  . Cough  . Fatigue  . Rash    HPI Paula Bates is a 44 y.o. female.   Pt is a 44 year old female that presents with headache, rash, fatigue, cough. This has been since Sunday. Taking Nyquil. Mild fever. Daughter has similar symptoms.      Past Medical History:  Diagnosis Date  . Bladder cancer ([place]) 12/2016  . Diabetes mellitus without complication (HCC)    gestational diabetes  . Herpes simplex infection    has never had outbreak, was tested in blood by family physician  . Hx of tonsillectomy    44 years old  . Infection    UTI  . Pregnancy induced hypertension   . Vaginal Pap smear, abnormal    cryotherapy, ok since    Patient Active Problem List   Diagnosis Date Noted  . Hypertension 12/15/2017  . S/P cesarean section 12/13/2017  . Breech presentation, no version   . Gestational hypertension 12/10/2017  . Vitamin D deficiency 10/19/2017  . Sickle cell trait ([place]) 10/13/2017  . 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 Gestational diabetes 10/12/2017  . Supervision of high risk pregnancy, antepartum, third trimester 10/12/2017  . H/O pre-eclampsia in prior pregnancy, currently pregnant 10/12/2017  . H/O preterm delivery, currently pregnant 10/12/2017  . Herpes simplex infection   . Uterine leiomyoma 07/13/2017  . 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 Prolactinoma ([place]) 03/29/2014  . Obesity, unspecified 03/29/2014    Past Surgical History:  Procedure Laterality Date  . BLADDER SURGERY  01/2016  . CESAREAN SECTION N/A 12/13/2017   Procedure: CESAREAN SECTION;  Surgeon: Florian Buff, MD;  Location: [facility];  Service: Obstetrics;  Laterality: N/A;  . TONSILLECTOMY  1996    OB History    Gravida  4   Para  3   Term  2   Preterm  1   AB  1   Living  3     SAB  1   TAB      Ectopic       Multiple  0   Live Births  3            Home Medications    Prior to Admission medications   Medication Sig Start Date End Date Taking? Authorizing Provider  aspirin 325 MG tablet Take 325 mg by mouth daily.    [provider]  cyclobenzaprine (FLEXERIL) 10 MG tablet Take 1 tablet (10 mg total) by mouth 2 (two) times daily as needed for muscle spasms. 05/20/20   Pattricia Boss, MD  meloxicam (MOBIC) 7.5 MG tablet Take 1 tablet (7.5 mg total) by mouth daily. 08/28/19   Zigmund Gottron, NP  metFORMIN (GLUCOPHAGE) 500 MG tablet Take 1 tablet (500 mg total) by mouth 2 (two) times daily with a meal. 12/15/17 12/15/18  Larwance Rote, MD  ondansetron (ZOFRAN) 4 MG tablet Take 1 tablet (4 mg total) by mouth every 8 (eight) hours as needed for nausea or vomiting. 02/13/20   Darr, Marguerita Beards, PA-C  Prenatal Vit-Fe Fumarate-FA (PRENATAL MULTIVITAMIN) TABS tablet Take 1 tablet by mouth daily at 12 noon.    [provider]  triamcinolone cream (KENALOG) 0.1 % Apply 1 application topically 2 (two) times daily. 09/13/20   Loura Halt A, NP  amLODipine (NORVASC) 5 MG  tablet Take 1 tablet (5 mg total) by mouth daily. Patient not taking: Reported on 03/03/2018 12/15/17 09/13/20  Larwance Rote, MD    Family History Family History  Problem Relation Age of Onset  . Diabetes Mother   . Hypertension Mother     Social History Social History   Tobacco Use  . Smoking status: Former Smoker    Packs/day: 0.25    Types: Cigarettes    Quit date: 10/15/2010    Years since quitting: 9.9  . Smokeless tobacco: Never Used  Substance Use Topics  . Alcohol use: No    Alcohol/week: 0.0 standard drinks    Comment: occ  . Drug use: No     Allergies   Patient has no known allergies.   Review of Systems Review of Systems   Physical Exam Triage Vital Signs ED Triage Vitals  Enc Vitals Group     BP 09/13/20 1052 129/81     Pulse Rate 09/13/20 1052 62     Resp 09/13/20 1052 18     Temp  09/13/20 1052 98.1 F (36.7 C)     Temp Source 09/13/20 1052 Oral     SpO2 09/13/20 1052 100 %     Weight --      Height --      Head Circumference --      Peak Flow --      Pain Score 09/13/20 1051 0     Pain Loc --      Pain Edu? --      Excl. in [place]? --    No data found.  Updated Vital Signs BP 129/81 (BP Location: Right Arm)   Pulse 62   Temp 98.1 F (36.7 C) (Oral)   Resp 18   LMP 09/11/2020 (Exact Date)   SpO2 100%   Visual Acuity Right Eye Distance:   Left Eye Distance:   Bilateral Distance:    Right Eye Near:   Left Eye Near:    Bilateral Near:     Physical Exam Vitals and nursing note reviewed.  Constitutional:      General: She is not in acute distress.    Appearance: Normal appearance. She is not ill-appearing, toxic-appearing or diaphoretic.  HENT:     Head: Normocephalic.     Nose: Nose normal.  Eyes:     Conjunctiva/sclera: Conjunctivae normal.  Pulmonary:     Effort: Pulmonary effort is normal.  Musculoskeletal:        General: Normal range of motion.     Cervical back: Normal range of motion.  Skin:    General: Skin is warm and dry.     Findings: No rash.  Neurological:     Mental Status: She is alert.  Psychiatric:        Mood and Affect: Mood normal.      UC Treatments / Results  Labs (all labs ordered are listed, but only abnormal results are displayed) Labs Reviewed  SARS CORONAVIRUS 2 (TAT 6-24 HRS) - Abnormal; Notable for the following components:      Result Value   SARS Coronavirus 2 POSITIVE (*)    All other components within normal limits    EKG   Radiology No results found.  Procedures Procedures (including critical care time)  Medications Ordered in UC Medications - No data to display  Initial Impression / Assessment and Plan / UC Course  I have reviewed the triage vital signs and the nursing notes.  Pertinent labs & imaging  results that were available during my care of the patient were reviewed by me and  considered in my medical decision making (see chart for details).     Viral illness Covid swab pending. Over-the-counter medicines for symptoms as needed.  Rash Most likely eczema flare.  Recommended triamcinolone cream and Eucerin cream for moisturization. Follow up as needed for continued or worsening symptoms  Final Clinical Impressions(s) / UC Diagnoses   Final diagnoses:  Viral illness  Rash     Discharge Instructions     We have tested you for COVID  Go home and quarantine until we get the  results.  You can take OTC medications as needed.  Triamcinolone cream for the rash. Make sure you keep your skin very moisturized with Eucerin cream for eczema. Follow up as needed for continued or worsening symptoms     ED Prescriptions    Medication Sig Dispense Auth. Provider   triamcinolone cream (KENALOG) 0.1 % Apply 1 application topically 2 (two) times daily. 30 g Loura Halt A, NP     PDMP not reviewed this encounter.   Orvan July, NP 09/14/20 [phone]

## 2020-09-13 NOTE — ED Triage Notes (Signed)
Pt reports she started having a rash in the back, arms and behind legs and then developed fever, fatigue and cough. NyQuil gives somewhat relief.

## 2020-09-14 ENCOUNTER — Telehealth: Payer: Self-pay | Admitting: Physician Assistant

## 2020-09-14 NOTE — Telephone Encounter (Signed)
Called to discuss with patient about Covid symptoms and the use of casirivimab/imdevimab, a monoclonal antibody infusion for those with mild to moderate Covid symptoms and at a high risk of hospitalization.  Pt is qualified for this infusion at the [facility] center due to; Specific high risk criteria : BMI > 25 and Other high risk medical condition per CDC:  high social vulnerablilty index   Message left to call back our hotline [phone] and sent mychart message.   Angelena Form PA-C  [facility]

## 2020-09-16 ENCOUNTER — Encounter: Payer: Self-pay | Admitting: Nurse Practitioner

## 2021-07-17 ENCOUNTER — Encounter: Payer: Self-pay | Admitting: Obstetrics and Gynecology

## 2021-07-17 ENCOUNTER — Ambulatory Visit: Payer: Medicaid Other | Admitting: Obstetrics and Gynecology

## 2021-07-17 DIAGNOSIS — E785 Hyperlipidemia, unspecified: Secondary | ICD-10-CM | POA: Insufficient documentation

## 2021-07-17 DIAGNOSIS — E119 Type 2 diabetes mellitus without complications: Secondary | ICD-10-CM | POA: Insufficient documentation

## 2021-07-17 DIAGNOSIS — Z6841 Body Mass Index (BMI) 40.0 and over, adult: Secondary | ICD-10-CM | POA: Insufficient documentation

## 2021-07-18 NOTE — Progress Notes (Signed)
Patient did not keep her GYN referral appointment for 07/17/2021.  Durene Romans MD Attending Center for [employer])

## 2021-12-23 ENCOUNTER — Ambulatory Visit: Payer: Medicaid Other | Admitting: Family Medicine

## 2021-12-25 ENCOUNTER — Ambulatory Visit (INDEPENDENT_AMBULATORY_CARE_PROVIDER_SITE_OTHER): Payer: Self-pay | Admitting: Obstetrics and Gynecology

## 2021-12-25 ENCOUNTER — Other Ambulatory Visit: Payer: Self-pay

## 2021-12-25 ENCOUNTER — Encounter: Payer: Self-pay | Admitting: Obstetrics and Gynecology

## 2021-12-25 VITALS — BP 140/86 | HR 89 | Wt 214.0 lb

## 2021-12-25 DIAGNOSIS — Z1231 Encounter for screening mammogram for malignant neoplasm of breast: Secondary | ICD-10-CM

## 2021-12-25 DIAGNOSIS — D259 Leiomyoma of uterus, unspecified: Secondary | ICD-10-CM

## 2021-12-25 NOTE — Progress Notes (Signed)
Patient is here to follow up from fibroids dx from 4 years ago   She complains of "on and off" abdominal pain that "feels like contractions"  It was explained to pt that she is due for a screening breast mammogram and pap smear. Ms. Bednarz would like to schedule her pap for another time  Pelvic ultrasound scheduled for Jan 25, 23 @ 2:30pm   Location: [facility]

## 2021-12-25 NOTE — Progress Notes (Signed)
°  CC: fibroid uterus Subjective:    Patient ID: Paula Bates, female    DOB: 11-Jan-1976, 46 y.o.   MRN: [id number]  HPI 46 yo G4P3 seen for discussion and evaluation of fibroid uterus.  The patient notes relatively normal menses but does feel intermittent cramping.  Pt did have a CT and [place] in 2019 which showed a 9.7 cm fibroid.  Pt actually had a cesarean section due to fetal malpresentation secondary to the large fibroid. She states  she feels her previous fibroid has grown larger over the last few years and states she "can see the fibroid" when she lays a certain way.   Review of Systems     Objective:   Physical Exam Vitals:   12/25/21 1454  BP: 140/86  Pulse: 89   Abdominal exam:  firm mass at the level of the umbilicus Bimanual exam deferred due to pt request( currently on menses)      Assessment & Plan:   1. Uterine leiomyoma, unspecified location Will check fibroid size with pelvic ultrasound, pt to follow up in 1 month for annual exam, bimanual exam at that time. - US PELVIC COMPLETE WITH TRANSVAGINAL; Future  2. Encounter for screening mammogram for breast cancer  - MM 3D SCREEN BREAST BILATERAL; Future    Griffin Basil, MD Faculty Attending, Center for [facility]

## 2022-01-01 ENCOUNTER — Ambulatory Visit (HOSPITAL_COMMUNITY): Payer: Self-pay

## 2022-01-07 ENCOUNTER — Ambulatory Visit: Admission: RE | Admit: 2022-01-07 | Payer: Medicaid Other | Source: Ambulatory Visit

## 2022-01-14 ENCOUNTER — Ambulatory Visit: Admission: RE | Admit: 2022-01-14 | Payer: 59 | Source: Ambulatory Visit

## 2022-01-17 ENCOUNTER — Ambulatory Visit (HOSPITAL_COMMUNITY)
Admission: RE | Admit: 2022-01-17 | Discharge: 2022-01-17 | Disposition: A | Discharge status: Home / Self Care | Payer: 59 | Source: Ambulatory Visit | Attending: Obstetrics and Gynecology | Admitting: Obstetrics and Gynecology

## 2022-01-17 ENCOUNTER — Other Ambulatory Visit: Payer: Self-pay

## 2022-01-17 DIAGNOSIS — D259 Leiomyoma of uterus, unspecified: Secondary | ICD-10-CM | POA: Insufficient documentation

## 2022-02-06 ENCOUNTER — Ambulatory Visit: Payer: Medicaid Other | Admitting: Advanced Practice Midwife

## 2022-04-15 ENCOUNTER — Encounter: Payer: Self-pay | Admitting: Medical

## 2022-04-15 ENCOUNTER — Ambulatory Visit (INDEPENDENT_AMBULATORY_CARE_PROVIDER_SITE_OTHER): Payer: 59 | Admitting: Medical

## 2022-04-15 VITALS — BP 136/85 | HR 81 | Wt 208.0 lb

## 2022-04-15 DIAGNOSIS — D259 Leiomyoma of uterus, unspecified: Secondary | ICD-10-CM | POA: Diagnosis not present

## 2022-04-15 NOTE — Progress Notes (Signed)
? ?  History:  ?[name] is a 46 y.o. [phone] who presents to clinic today for [place] results and management of persistent uterine fibroid. Patient had 9 cm fibroid noted on CT scan in 2019. Repeat US from 01/2022 shows fibroid is unchanged. Patient states she continues to have regular, but heavy periods. She has intermittent sharp pains. She is concerned that she can see the fibroid in her stomach when laying flat. She feels the fibroid needs to be removed, but is very nervous about surgery.  ? ?The following portions of the patient's history were reviewed and updated as appropriate: allergies, current medications, family history, past medical history, social history, past surgical history and problem list. ? ?Review of Systems:  ?Review of Systems  ?Constitutional:  Negative for fever.  ?Gastrointestinal:  Positive for abdominal pain.  ?Genitourinary:   ?     Neg - vaginal bleeding  ? ?  ?Objective:  ?Physical Exam ?BP 136/85   Pulse 81   Wt 208 lb (94.3 kg)   LMP 04/09/2022 (Approximate)   BMI 38.04 kg/m?  ?Physical Exam ?Constitutional:   ?   General: She is not in acute distress. ?   Appearance: Normal appearance. She is obese.  ?Cardiovascular:  ?   Rate and Rhythm: Normal rate.  ?Pulmonary:  ?   Effort: Pulmonary effort is normal.  ?Abdominal:  ?   General: There is no distension.  ?   Palpations: Abdomen is soft.  ?Skin: ?   General: Skin is warm and dry.  ?   Coloration: Skin is not pale.  ?Neurological:  ?   Mental Status: She is alert and oriented to person, place, and time.  ?Psychiatric:     ?   Mood and Affect: Mood normal.  ? ? ?Health Maintenance Due  ?Topic Date Due  ? HEMOGLOBIN A1C  Never done  ? COVID-19 Vaccine (1) Never done  ? FOOT EXAM  Never done  ? OPHTHALMOLOGY EXAM  Never done  ? Hepatitis C Screening  Never done  ? PAP SMEAR-Modifier  06/29/2020  ? ? ? ?Assessment & Plan:  ?1. Uterine leiomyoma, unspecified location ?- Patient refused pap smear today  ?- Mammogram ordered  previously and patient states she will schedule ?- Would like to meet with surgeon to discuss next steps and all options ?- Will schedule with Dr. Ilda Basset at patients request, who has seen patient previously, for surgical consult  ?- Various options briefly discussed with patient for surgical and non-surgical management  ? ?Approximately 30 minutes of total time was spent with this patient on chart review, history taking, patient education, physical exam and documentation.  ? ?Luvenia Redden, PA-C ?04/16/2022 ?1:21 PM ? ?

## 2022-04-15 NOTE — Progress Notes (Signed)
I offered patient to get her cervical cancer screening done today and she declined stating " I rather reschedule it for another time" ? ?Patient was informed that a mammogram order was put and she stated that she will call and schedule her appointment according her availability. Pangelinan was provided the name, number and address for future mammogram appointments. ?

## 2022-05-19 ENCOUNTER — Other Ambulatory Visit (HOSPITAL_COMMUNITY)
Admission: RE | Admit: 2022-05-19 | Discharge: 2022-05-19 | Disposition: A | Discharge status: Home / Self Care | Payer: 59 | Source: Ambulatory Visit | Attending: Obstetrics and Gynecology | Admitting: Obstetrics and Gynecology

## 2022-05-19 ENCOUNTER — Ambulatory Visit (INDEPENDENT_AMBULATORY_CARE_PROVIDER_SITE_OTHER): Payer: 59 | Admitting: Obstetrics and Gynecology

## 2022-05-19 VITALS — BP 127/90 | HR 94 | Wt 210.2 lb

## 2022-05-19 DIAGNOSIS — Z1151 Encounter for screening for human papillomavirus (HPV): Secondary | ICD-10-CM

## 2022-05-19 DIAGNOSIS — Z1231 Encounter for screening mammogram for malignant neoplasm of breast: Secondary | ICD-10-CM

## 2022-05-19 DIAGNOSIS — D259 Leiomyoma of uterus, unspecified: Secondary | ICD-10-CM

## 2022-05-19 MED ORDER — FERROUS SULFATE 325 (65 FE) MG PO TABS: 325.0000 mg | ORAL_TABLET | ORAL | 1 refills | Status: AC

## 2022-05-19 NOTE — Progress Notes (Signed)
Obstetrics and Gynecology New Patient Evaluation  Appointment Date: 05/20/2022  OBGYN Clinic: [facility]  Primary Care Provider: Barrie Lyme  Referring Provider: Kerry Hough PA  Chief Complaint: consult for fibroids History of Present Illness: Paula Bates is a 46 y.o. [phone] (Patient's last menstrual period was 04/28/2022 (approximate).), seen for the above chief complaint. Her past medical history is significant for BMI high 30s, HTN, DM2, h/o c/s x 1  Patient seen by Dr. Elgie Congo on 1/18 for fibroid evaluation and u/s ordered for further evaluation, but she was seen back by Kerry Hough on 5/9 for follow up, but then set up to see me for fibroid discussion.  Ultrasound on 2/10 showed a 15cm uterus with a 9cm (stable) fundal fibroid with normal stripe and ovaries (see below).   Her periods are 5 days and heavy but not really that painful and they are regular, qmonth. Patient maybe use OCPs in the remote past   Review of Systems: Pertinent items noted in HPI and remainder of comprehensive ROS otherwise negative.    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 A2/B GDM)  Required Referrals for A1GDM or A2GDM: '[ ]'$  Diabetes Education  and Testing Supplies '[ ]'$  Nutrition Cousult  For A2/B GDM or higher classes of DM '[ ]'$  Diabetes Education and Testing Supplies '[ ]'$  Nutrition Counsult '[ ]'$  Fetal ECHO after [redacted] weeks     Gestational hypertension 12/10/2017   Herpes simplex infection    has never had outbreak, was tested in blood by family physician   History of pre-eclampsia 10/12/2017   Has not been on baby ASA   History of preterm delivery 10/12/2017   Painless cervical dilation with 1st delivery Not on any meds with second No meds/cerclage this pregnancy, patient was offered and she declined   Hx of tonsillectomy    46 years old   Hyperlipidemia    Infection    UTI   Pregnancy induced hypertension    Sickle cell trait ([place]) 10/13/2017   Vaginal Pap smear, abnormal    cryotherapy, ok since    Past Surgical History:  Past Surgical History:  Procedure Laterality Date   BLADDER SURGERY  01/2016   CESAREAN SECTION N/A 12/13/2017   Procedure: CESAREAN SECTION;  Surgeon: Florian Buff, MD;  Location: [place];  Service: Obstetrics;  Laterality: N/A;   TONSILLECTOMY  1996    Past Obstetrical History:  OB History  Gravida Para Term Preterm AB Living  '4 3 2 1 1 3  '$ SAB IAB Ectopic Multiple Live Births  1     0 3    # Outcome Date GA Lbr Len/2nd Weight Sex Delivery Anes PTL Lv  4 Term 12/13/17 [redacted]w[redacted]d 7 lb  10.8 oz (3.481 kg) M CS-LTranv Spinal  LIV  3 Term 04/15/11 [redacted]w[redacted]d 7 lb 6 oz (3.345 kg) M Vag-Spont EPI N LIV  2 SAB 2011          1 Preterm 04/21/06 [id number] lb 4 oz (1.928 kg) F Vag-Spont EPI Y LIV     Past Gynecological History: As per HPI. History of Pap Smear(s): unknown She is currently using no method for contraception.   Social History:  Social History   Socioeconomic History   Marital status: Divorced    Spouse name: Not on file   Number of children: Not on file   Years of education: Not on file   Highest education level: Not on file  Occupational History   Not on file  Tobacco Use    Smoking status: Former    Packs/day: 0.25    Types: Cigarettes    Quit date: 10/15/2010    Years since quitting: 11.6   Smokeless tobacco: Never  Substance and Sexual Activity   Alcohol use: No    Alcohol/week: 0.0 standard drinks of alcohol    Comment: occ   Drug use: No   Sexual activity: Not Currently    Birth control/protection: None  Other Topics Concern   Not on file  Social History Narrative   Not on file   Social Determinants of Health   Financial Resource Strain: Not on file  Food Insecurity: Not on file  Transportation Needs: Not on file  Physical Activity: Not on file  Stress: Not on file  Social Connections: Not on file  Intimate Partner Violence: Not on file    Family History:  Family History  Problem Relation Age of Onset   Diabetes Mother    Hypertension Mother     Medications SyAmberly Livasad no medications administered during this visit. Current Outpatient Medications  Medication Sig Dispense Refill   atorvastatin (LIPITOR) 10 MG tablet Take 10 mg by mouth daily.     ferrous sulfate (FERROUSUL) 325 (65 FE) MG tablet Take 1 tablet (325 mg total) by mouth every other day. 90 tablet 1   losartan (COZAAR) 100 MG tablet Take 100 mg by mouth daily.     metFORMIN (GLUCOPHAGE) 500 MG tablet Take 1 tablet (500 mg total) by mouth 2 (two) times daily with a meal. 60 tablet 11   aspirin 325 MG tablet Take 325 mg by mouth daily. (Patient not taking: Reported on 12/25/2021)     cyclobenzaprine (FLEXERIL) 10 MG tablet Take 1 tablet (10 mg total) by mouth 2 (two) times daily as needed for muscle spasms. (Patient not taking: Reported on 12/25/2021) 20 tablet 0   lisinopril (ZESTRIL) 10 MG tablet Take by mouth. (Patient not taking: Reported on 04/15/2022)     meloxicam (MOBIC) 7.5 MG tablet Take 1 tablet (7.5 mg total) by mouth daily. (Patient not taking: Reported on 12/25/2021) 20 tablet 0   ondansetron (ZOFRAN) 4 MG tablet Take 1 tablet (4 mg total) by mouth every 8  (eight) hours as needed for nausea or vomiting. (Patient not taking: Reported on 12/25/2021) 4 tablet 0   Prenatal Vit-Fe Fumarate-FA (PRENATAL MULTIVITAMIN) TABS tablet Take 1 tablet by mouth daily at 12 noon. (Patient not taking: Reported on 12/25/2021)     triamcinolone cream (KENALOG) 0.1 % Apply 1 application topically 2 (two) times daily. (Patient not taking: Reported on 12/25/2021) 30 g 0   No current facility-administered medications for this visit.    Allergies Patient has  no known allergies.   Physical Exam:  BP 127/90   Pulse 94   Wt 210 lb 3.2 oz (95.3 kg)   LMP 04/28/2022 (Approximate)   BMI 38.45 kg/m  Body mass index is 38.45 kg/m. General appearance: Well nourished, well developed female in no acute distress.  Neck:  Supple, normal appearance, and no thyromegaly  Cardiovascular: normal s1 and s2.  No murmurs, rubs or gallops. Respiratory:  Clear to auscultation bilateral. Normal respiratory effort Abdomen: soft, nttp, fibroid uterus to umbilicus (see below) Neuro/Psych:  Normal mood and affect.  Skin:  Warm and dry.  Lymphatic:  No inguinal lymphadenopathy.   Pelvic exam: is limited by body habitus EGBUS: within normal limits Vagina: within normal limits and with no blood or discharge in the vault Cervix: normal appearing cervix without tenderness, discharge or lesions.  Uterus:  enlarged, c/w 20 week size and non tender Adnexa:  normal adnexa and no mass, fullness, tenderness Rectovaginal: deferred  Laboratory: UPT neg 03/13/22 poc cbc with Hgb 12.0  Radiology:  Narrative & Impression  CLINICAL DATA:  Uterine leiomyoma, G4P3A1, LMP 12/27/2021, history of Caesarean section   EXAM: TRANSABDOMINAL AND TRANSVAGINAL ULTRASOUND OF PELVIS   TECHNIQUE: Both transabdominal and transvaginal ultrasound examinations of the pelvis were performed. Transabdominal technique was performed for global imaging of the pelvis including uterus, ovaries, adnexal regions, and  pelvic cul-de-sac. It was necessary to proceed with endovaginal exam following the transabdominal exam to visualize the uterus, endometrium, and ovaries.   COMPARISON:  12/10/2017   FINDINGS: Uterus   Measurements: 15.2 x 9.8 x 11.1 cm = volume: 862 mL. Retroflexed. Large leiomyoma at fundus 9.2 x 8.2 x 9.3 cm, previously 9.7 x 7.3 x 9.2 cm.   Endometrium   Thickness: 7 mm.  No endometrial fluid or mass   Right ovary   Measurements: 1.8 x 1.4 x 1.8 cm = volume: 2.4 mL. Normal morphology without mass   Left ovary   Measurements: 2.7 x 1.3 x 1.3 cm = volume: 2.4 mL. Normal morphology without mass   Other findings   No free pelvic fluid.  No adnexal masses.   IMPRESSION: Again identified large leiomyoma at uterine fundus 9.3 cm in greatest size.   Remainder of exam unremarkable.     Electronically Signed   By: Lavonia Dana M.D.   On: 01/17/2022 16:56    Assessment: pt stable  Plan: 1. Screening mammogram for breast cancer ordered  2. Screening for human papillomavirus (HPV) - Cytology - PAP( )  3. Uterine leiomyoma, unspecified location I told her that surgery would be high risk, given her bmi, co-moribidities, prior c-section and size of fibroid. I told her if surgery is considered then I would recommend a total hysterectomy and not just a myomectomy given her age and risk of scar tissue formation with a myomectomy and potential need for hysterectomy if the myomectomy doesn't work. I also told her I would recommend a trial of medicines to help with the heavy menstrual bleeding but she declines because she would like to do something that treats the size of the fibroid  RTC in 62mafter patient meets with PCP to see if okay for surgery and finalize a plan.   No orders of the defined types were placed in this encounter.  4. Anemia Pt not on iron. Every other day iron sent in   CAletha Halim JBrooke BonitoMD Attending Center for [employer]

## 2022-05-19 NOTE — Progress Notes (Signed)
Patient mammogram scheduled for 06/04/22 at 3 PM at the [facility].

## 2022-05-19 NOTE — Patient Instructions (Signed)
Mammogram scheduled for 06/04/22 at 3 PM at the [facility].   Address:  [address],  [place]  [address]

## 2022-05-22 LAB — CYTOLOGY - PAP
Chlamydia: NEGATIVE
Comment: NEGATIVE
Comment: NEGATIVE
Comment: NEGATIVE
Comment: NORMAL
Diagnosis: NEGATIVE
High risk HPV: NEGATIVE
Neisseria Gonorrhea: NEGATIVE
Trichomonas: NEGATIVE

## 2022-06-04 ENCOUNTER — Ambulatory Visit: Payer: 59

## 2023-04-09 ENCOUNTER — Ambulatory Visit: Payer: Commercial Managed Care - PPO | Admitting: Obstetrics and Gynecology

## 2023-04-30 ENCOUNTER — Ambulatory Visit: Payer: Commercial Managed Care - PPO | Admitting: Obstetrics and Gynecology

## 2023-05-14 ENCOUNTER — Encounter: Payer: Self-pay | Admitting: Obstetrics and Gynecology

## 2023-05-14 ENCOUNTER — Other Ambulatory Visit (HOSPITAL_COMMUNITY)
Admission: RE | Admit: 2023-05-14 | Discharge: 2023-05-14 | Disposition: A | Discharge status: Home / Self Care | Payer: Commercial Managed Care - PPO | Source: Ambulatory Visit | Attending: Obstetrics and Gynecology | Admitting: Obstetrics and Gynecology

## 2023-05-14 ENCOUNTER — Other Ambulatory Visit: Payer: Self-pay

## 2023-05-14 ENCOUNTER — Ambulatory Visit (INDEPENDENT_AMBULATORY_CARE_PROVIDER_SITE_OTHER): Payer: Commercial Managed Care - PPO | Admitting: Obstetrics and Gynecology

## 2023-05-14 VITALS — BP 129/87 | HR 88 | Wt 200.9 lb

## 2023-05-14 DIAGNOSIS — N946 Dysmenorrhea, unspecified: Secondary | ICD-10-CM

## 2023-05-14 DIAGNOSIS — D219 Benign neoplasm of connective and other soft tissue, unspecified: Secondary | ICD-10-CM

## 2023-05-14 LAB — POCT PREGNANCY, URINE: Preg Test, Ur: NEGATIVE

## 2023-05-14 NOTE — Progress Notes (Signed)
GYNECOLOGY VISIT  Patient name: Paula Bates MRN [id number]  Date of birth: 07-07-1976 Chief Complaint:   fibords follow up  History:  Paula Bates is a 47 y.o. [phone] being seen today for discussion of fibroids. Continues to have regular monthly menses, mostly bothered by size of fibroids and pain with menses.  Will have bad cramps but menses aren't changed much. Initially very unsure about undergoing surgery due to concerns for childcare and risks of comp Cystoscopic resection of bladder cancer at least 4 times  1 prior cesarean section Had follow up with her endocrinologist and was told she was safe for surgery from an endocrine perspective   Past Medical History:  Diagnosis Date   Bladder cancer (HCC) 12/2016   Diabetes mellitus without complication (HCC)    gestational diabetes   DM (diabetes mellitus), type 2 (HCC)    Gestational diabetes 10/12/2017   Early diagnosis--GDM x 2 pregnancies  Current Diabetic Medications:  Insulin  [ ]  Aspirin 81 mg daily after 12 weeks; discontinue after 36 weeks (? A2/B GDM)  Required Referrals for A1GDM or A2GDM: [ ]  Diabetes Education and Testing Supplies [ ]  Nutrition Cousult  For A2/B GDM or higher classes of DM [ ]  Diabetes Education and Testing Supplies [ ]  Nutrition Counsult [ ]  Fetal ECHO after [redacted] weeks     Gestational hypertension 12/10/2017   Herpes simplex infection    has never had outbreak, was tested in blood by family physician   History of pre-eclampsia 10/12/2017   Has not been on baby ASA   History of preterm delivery 10/12/2017   Painless cervical dilation with 1st delivery Not on any meds with second No meds/cerclage this pregnancy, patient was offered and she declined   Hx of tonsillectomy    47 years old   Hyperlipidemia    Infection    UTI   Pregnancy induced hypertension    Sickle cell trait (HCC) 10/13/2017   Vaginal Pap smear, abnormal    cryotherapy, ok since    Past Surgical History:  Procedure  Laterality Date   BLADDER SURGERY  01/2016   CESAREAN SECTION N/A 12/13/2017   Procedure: CESAREAN SECTION;  Surgeon: Lazaro Arms, MD;  Location: [facility];  Service: Obstetrics;  Laterality: N/A;   TONSILLECTOMY  1996    The following portions of the patient's history were reviewed and updated as appropriate: allergies, current medications, past family history, past medical history, past social history, past surgical history and problem list.   Health Maintenance:   Last pap     Component Value Date/Time   DIAGPAP  05/19/2022 1640    - Negative for intraepithelial lesion or malignancy (NILM)   HPVHIGH Negative 05/19/2022 1640   ADEQPAP  05/19/2022 1640    Satisfactory for evaluation; transformation zone component PRESENT.    High Risk HPV: Positive  Adequacy:  Satisfactory for evaluation, transformation zone component PRESENT  Diagnosis:  Atypical squamous cells of undetermined significance (ASC-US)  Last mammogram:    Review of Systems:  Pertinent items are noted in HPI. Comprehensive review of systems was otherwise negative.   Objective:  Physical Exam BP 129/87   Pulse 88   Wt 200 lb 14.4 oz (91.1 kg)   LMP 05/04/2023 (Approximate)   BMI 36.75 kg/m    Physical Exam Vitals and nursing note reviewed. Exam conducted with a chaperone present.  Constitutional:      Appearance: Normal appearance.  HENT:     Head: Normocephalic and  atraumatic.  Pulmonary:     Effort: Pulmonary effort is normal.     Breath sounds: Normal breath sounds.  Abdominal:     Comments: Enlarged uterus with dominant fundal height beyond the umbilicus, mobile and nontender Well healed low transverse incision   Genitourinary:    General: Normal vulva.     Exam position: Lithotomy position.     Vagina: Normal.     Cervix: Normal.     Comments: Normal appearing cervix, well supported Skin:    General: Skin is warm and dry.  Neurological:     General: No focal deficit present.      Mental Status: She is alert.  Psychiatric:        Mood and Affect: Mood normal.        Behavior: Behavior normal.        Thought Content: Thought content normal.        Judgment: Judgment normal.      Labs and Imaging US PELVIC COMPLETE WITH TRANSVAGINAL CLINICAL DATA:  Uterine leiomyoma, G4P3A1, LMP 12/27/2021, history of Caesarean section  EXAM: TRANSABDOMINAL AND TRANSVAGINAL ULTRASOUND OF PELVIS  TECHNIQUE: Both transabdominal and transvaginal ultrasound examinations of the pelvis were performed. Transabdominal technique was performed for global imaging of the pelvis including uterus, ovaries, adnexal regions, and pelvic cul-de-sac. It was necessary to proceed with endovaginal exam following the transabdominal exam to visualize the uterus, endometrium, and ovaries.  COMPARISON:  12/10/2017  FINDINGS: Uterus  Measurements: 15.2 x 9.8 x 11.1 cm = volume: 862 mL. Retroflexed. Large leiomyoma at fundus 9.2 x 8.2 x 9.3 cm, previously 9.7 x 7.3 x 9.2 cm.  Endometrium  Thickness: 7 mm.  No endometrial fluid or mass  Right ovary  Measurements: 1.8 x 1.4 x 1.8 cm = volume: 2.4 mL. Normal morphology without mass  Left ovary  Measurements: 2.7 x 1.3 x 1.3 cm = volume: 2.4 mL. Normal morphology without mass  Other findings  No free pelvic fluid.  No adnexal masses.  IMPRESSION: Again identified large leiomyoma at uterine fundus 9.3 cm in greatest size.  Remainder of exam unremarkable.  Electronically Signed   By: Ulyses Southward M.D.   On: 01/17/2022 16:56  Endometrial Biopsy Procedure  Patient identified, informed consent performed,  indication reviewed, consent signed.  Reviewed risk of perforation, pain, bleeding, insufficient sample, etc were reviewd. Time out was performed.  Urine pregnancy test negative.  Speculum placed in the vagina.  Cervix visualized.  Cleaned with Betadine x 2.  Anterior lip of cervix grasped anteriorly with a single tooth  tenaculum.  Paracervical block was not administered.  Endometrial pipelle was used to draw up 1cc of 1% lidocaine, introduced into the cervical os and instilled into the endometrial cavity.  The pipelle was passed twice without difficulty and sample obtained. Tenaculum was removed, good hemostasis noted.  Patient tolerated procedure well.  Patient was given post-procedure instructions.      Assessment & Plan:   1. Fibroid 2. Dysmenorrhea Now s/p uncomplicated EMB. Discussed options for management of fibroids including medications, UFE, myomectomy and hysterectomy. Recommend against myomectomy given risk of fibroid regrowth and has completed childbearing. Patient had some hesitancy regarding having surgery due to risk of complications. Provided counseling regarding operative risk, immediate postop expectations, need for periop anti-coagulation due to hx of malignancy, 1 night stay, etc. All questions answered - Surgical pathology  Patient desires surgical management with RA-TLH, BS,cyst.  The risks of surgery were discussed in detail with the patient including  but not limited to: bleeding which may require transfusion or reoperation; infection which may require prolonged hospitalization or re-hospitalization and antibiotic therapy; injury to bowel, bladder, ureters and major vessels or other surrounding organs which may lead to other procedures; formation of adhesions; need for additional procedures including laparotomy or subsequent procedures secondary to intraoperative injury or abnormal pathology; thromboembolic phenomenon; incisional problems and other postoperative or anesthesia complications.  Patient was told that the likelihood that her condition and symptoms will be treated effectively with this surgical management was high; the postoperative expectations were also discussed in detail. The patient also understands the alternative treatment options which were discussed in full. All questions were  answered.  She was told that she will be contacted by our surgical scheduler regarding the time and date of her surgery; routine preoperative instructions will be given to her by the preoperative nursing team.  Printed patient education handouts about the procedure were given to the patient to review at home.   Routine preventative health maintenance measures emphasized.  Paula Shire, MD Minimally Invasive Gynecologic Surgery Center for [facility], [facility]

## 2023-05-18 LAB — SURGICAL PATHOLOGY

## 2024-02-26 ENCOUNTER — Other Ambulatory Visit: Payer: Self-pay

## 2024-02-26 ENCOUNTER — Encounter (HOSPITAL_COMMUNITY): Payer: Self-pay | Admitting: *Deleted

## 2024-02-26 ENCOUNTER — Emergency Department (HOSPITAL_COMMUNITY)
Admission: EM | Admit: 2024-02-26 | Discharge: 2024-02-26 | Disposition: A | Discharge status: Home / Self Care | Attending: Emergency Medicine | Admitting: Emergency Medicine

## 2024-02-26 DIAGNOSIS — S39012A Strain of muscle, fascia and tendon of lower back, initial encounter: Secondary | ICD-10-CM | POA: Diagnosis not present

## 2024-02-26 DIAGNOSIS — Z79899 Other long term (current) drug therapy: Secondary | ICD-10-CM | POA: Diagnosis not present

## 2024-02-26 DIAGNOSIS — I10 Essential (primary) hypertension: Secondary | ICD-10-CM | POA: Insufficient documentation

## 2024-02-26 DIAGNOSIS — Y92410 Unspecified street and highway as the place of occurrence of the external cause: Secondary | ICD-10-CM | POA: Insufficient documentation

## 2024-02-26 DIAGNOSIS — Z7982 Long term (current) use of aspirin: Secondary | ICD-10-CM | POA: Diagnosis not present

## 2024-02-26 DIAGNOSIS — R030 Elevated blood-pressure reading, without diagnosis of hypertension: Secondary | ICD-10-CM

## 2024-02-26 DIAGNOSIS — M545 Low back pain, unspecified: Secondary | ICD-10-CM | POA: Diagnosis present

## 2024-02-26 MED ORDER — IBUPROFEN 400 MG PO TABS
400.0000 mg | ORAL_TABLET | Freq: Once | ORAL | Status: AC
  Administered 2024-02-26: 400 mg via ORAL
  Filled 2024-02-26: qty 1

## 2024-02-26 MED ORDER — ACETAMINOPHEN 500 MG PO TABS: 1000.0000 mg | ORAL_TABLET | Freq: Once | ORAL | Status: DC

## 2024-02-26 MED ORDER — METHOCARBAMOL 750 MG PO TABS: 750.0000 mg | ORAL_TABLET | Freq: Three times a day (TID) | ORAL | 0 refills | Status: AC | PRN
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
## 2024-02-26 NOTE — Discharge Instructions (Signed)
 It was our pleasure to provide your ER care today - we hope that you feel better.  Take acetaminophen or ibuprofen as need for pain. You may also take robaxin as need for muscle pain/spasm - no driving when taking.   Follow up with primary care doctor in 1-2 weeks if symptoms fail to improve/resolve. Your blood pressure is high today - continue meds, and follow up closely with primary care doctor in one - two weeks.   Return to ER if worse, new symptoms, new/severe pain, trouble breathing, or other emergency concern.

## 2024-03-01 ENCOUNTER — Ambulatory Visit: Payer: Commercial Managed Care - PPO | Admitting: Obstetrics and Gynecology

## 2024-03-01 ENCOUNTER — Encounter: Payer: Self-pay | Admitting: Obstetrics and Gynecology

## 2024-03-01 VITALS — BP 132/83 | HR 78 | Wt 204.3 lb

## 2024-03-01 DIAGNOSIS — D219 Benign neoplasm of connective and other soft tissue, unspecified: Secondary | ICD-10-CM

## 2024-03-01 DIAGNOSIS — N946 Dysmenorrhea, unspecified: Secondary | ICD-10-CM

## 2024-03-01 NOTE — Patient Instructions (Signed)
 Hysterectomy Information  A hysterectomy is a surgery to take out the uterus. The uterus is where a baby grows when a person is pregnant. Other organs may also be taken out. They are: The organs that make eggs (ovaries). The tubes that move the egg to the uterus (fallopian tubes). The lowest part of the uterus (cervix). After the surgery, you'll no longer have your period. You'll not be able to get pregnant. This surgery can affect the way you feel. Talk with your health care provider about the physical and emotional effects of this surgery. Why is a hysterectomy done? This surgery may be done if: You have growths in the uterus, such as fibroids. This is the most common reason. The lining of the uterus grows outside the uterus. Your uterus has moved down and bulges down into the vagina. You have abnormal or heavy bleeding during your period. You have pain in the pelvic area, and the pain lasts a long time. You have cancer of the uterus or cervix. What are the risks of hysterectomy? Your provider will talk with you about risks. These may include: Bleeding or blood clots in the legs or lung. Infection. Injury to areas close to the uterus. These include the nerves, bladder, or bowel. Reaction to the medicines used. Early symptoms of menopause, if both ovaries are taken out. These include hot flashes, vaginal dryness, night sweats, and lack of sleep. What can be taken out during a hysterectomy? This surgery can be done: To take out the top part of the uterus only. The cervix is not taken out. To take out the uterus and cervix. To take out the uterus, the cervix, and the tissue that holds the uterus. In some cases, your ovaries may also be taken out. The parts that will be taken out are based on the reason why you need this surgery. What are types of this surgery? The uterus can be taken out in many ways. Talk with your provider about the best surgery for you.  Here are the ones that are  done most often. There are benefits and risks for each. Abdominal hysterectomy One big cut is made in your belly. The uterus and other organs are taken out through this cut. This method is used: When your provider wants a better way to get to your uterus. If you have scars inside your belly that stick to other parts or organs. These are called adhesions. If you have this surgery: You may take longer to get well because of the big cut in your belly. There's a higher risk of injury to other tissues and organs. You may get adhesions. Vaginal hysterectomy Cuts are made in the top of the vagina. No cuts are made on your skin. The uterus is taken out through the vagina. If you have this surgery: You may have a shorter stay in the hospital. There's a lower risk of getting adhesions. You may have fewer problems after surgery. Laparoscopic-assisted vaginal hysterectomy (LAVH) Many small cuts are made in your belly and inside your vagina. LAVH is done with long, thin tools and cameras. Robots are also used. LAVH takes longer. There's also higher risk of injury to other organs. But there's less bleeding. If you have LAVH: There's less risk that germs will get into your body. You'll have a short stay in the hospital. You'll go back to your normal activities sooner. What happens after the surgery? You will be given pain medicine. You may need to stay in the hospital  for 1-2 days. You may need to have an adult stay with you for a few days after you go home. You will not be able to lift heavy objects. You will not be able to put anything in your vagina for the first 6 weeks or for the time told by your provider. This includes douching, having sex, and using tampons. If your ovaries were taken out, you may get hot flashes. You may also have night sweats, vaginal dryness, and trouble sleeping. Questions to ask your health care provider Is this surgery needed? What other options do I have? What organs  need to be taken out? How long will I need to stay in the hospital? How long will I need to get better at home? What symptoms can I expect after the surgery? This information is not intended to replace advice given to you by your health care provider. Make sure you discuss any questions you have with your health care provider. Document Revised: 08/27/2023 Document Reviewed: 03/13/2023 Elsevier Patient Education  2024 [employer].

## 2024-03-01 NOTE — Progress Notes (Unsigned)
 GYNECOLOGY VISIT  Patient name: Paula Bates MRN [id number]  Date of birth: [birth year] Chief Complaint:   No chief complaint on file.  History:  Paula Bates is a 48 y.o. [phone] being seen today for fibroid/dysmenorrhea follow up.    Discussed the use of AI scribe software for clinical note transcription with the patient, who gave verbal consent to proceed.  History of Present Illness Paula Bates is a 48 year old female with fibroid tumors who presents with concerns about undergoing a hysterectomy.  She experiences symptoms from fibroid tumors, including abdominal distension and painful menstrual cramps. She is not interested in having more children and seeks relief from these symptoms.  She has significant concern about undergoing a hysterectomy, particularly due to the risk of blood clots associated with major surgery. She is unsure whether to opt for a partial or total hysterectomy and is considering the timing of the surgery, preferring to wait until after her daughter's graduation in June.  She has a history of bladder cancer, which increases her risk of blood clots, a major concern for her regarding the surgery.  She is aware of the potential risks of surgery, including bleeding, infection, and injury to surrounding organs, and experiences anxiety about these possibilities. Despite her concerns, she is leaning towards proceeding with the hysterectomy to alleviate the symptoms caused by the fibroids.  She inquires about the possibility of using birth control pills post-surgery to manage bleeding and prevent pregnancy, but specific types of birth control may not be suitable. She is also concerned about the potential for bleeding during a myomectomy and the associated risks.    Past Medical History:  Diagnosis Date   Bladder cancer (HCC) 12/2016   Diabetes mellitus without complication (HCC)    gestational diabetes   DM (diabetes mellitus), type 2 (HCC)     Gestational diabetes 10/12/2017   Early diagnosis--GDM x 2 pregnancies  Current Diabetic Medications:  Insulin  [ ]  Aspirin 81 mg daily after 12 weeks; discontinue after 36 weeks (? A2/B GDM)  Required Referrals for A1GDM or A2GDM: [ ]  Diabetes Education and Testing Supplies [ ]  Nutrition Cousult  For A2/B GDM or higher classes of DM [ ]  Diabetes Education and Testing Supplies [ ]  Nutrition Counsult [ ]  Fetal ECHO after [redacted] weeks     Gestational hypertension 12/10/2017   Herpes simplex infection    has never had outbreak, was tested in blood by family physician   History of pre-eclampsia 10/12/2017   Has not been on baby ASA   History of preterm delivery 10/12/2017   Painless cervical dilation with 1st delivery Not on any meds with second No meds/cerclage this pregnancy, patient was offered and she declined   Hx of tonsillectomy    48 years old   Hyperlipidemia    Infection    UTI   Pregnancy induced hypertension    Sickle cell trait (HCC) 10/13/2017   Vaginal Pap smear, abnormal    cryotherapy, ok since    Past Surgical History:  Procedure Laterality Date   BLADDER SURGERY  01/2016   CESAREAN SECTION N/A 12/13/2017   Procedure: CESAREAN SECTION;  Surgeon: Lazaro Arms, MD;  Location: [facility] BIRTHING SUITES;  Service: Obstetrics;  Laterality: N/A;   TONSILLECTOMY  1996    The following portions of the patient's history were reviewed and updated as appropriate: allergies, current medications, past family history, past medical history, past social history, past surgical history and problem list.   Health Maintenance:  Last pap ***. Results were: {Pap findings:25134}. H/O abnormal pap: {yes/yes***/no:23866} Last mammogram: ***. Results were: {normal, abnormal, n/a:23837}. Family h/o breast cancer: {yes***/no:23838}   Review of Systems:  {Ros - complete:30496} Comprehensive review of systems was otherwise negative.   Objective:  Physical Exam BP 132/83   Pulse 78   Wt 204 lb 4.8 oz  (92.7 kg)   LMP 02/28/2024 (Approximate)   BMI 37.37 kg/m    Physical Exam   Labs and Imaging No results found.     Assessment & Plan:   1. Dysmenorrhea (Primary) *** - US PELVIC COMPLETE WITH TRANSVAGINAL; Future  2. Fibroid *** - US PELVIC COMPLETE WITH TRANSVAGINAL; Future    *** Routine preventative health maintenance measures emphasized.  Lorriane Shire, MD Minimally Invasive Gynecologic Surgery [facility] for [facility], [facility]

## 2024-03-09 ENCOUNTER — Ambulatory Visit (HOSPITAL_COMMUNITY)
Admission: RE | Admit: 2024-03-09 | Discharge: 2024-03-09 | Disposition: A | Discharge status: Home / Self Care | Source: Ambulatory Visit | Attending: Obstetrics and Gynecology | Admitting: Obstetrics and Gynecology

## 2024-03-09 DIAGNOSIS — D219 Benign neoplasm of connective and other soft tissue, unspecified: Secondary | ICD-10-CM | POA: Insufficient documentation

## 2024-03-09 DIAGNOSIS — N946 Dysmenorrhea, unspecified: Secondary | ICD-10-CM | POA: Diagnosis present

## 2024-03-15 ENCOUNTER — Encounter: Payer: Self-pay | Admitting: Obstetrics and Gynecology

## 2024-04-13 ENCOUNTER — Other Ambulatory Visit: Payer: Self-pay

## 2024-04-13 ENCOUNTER — Ambulatory Visit (INDEPENDENT_AMBULATORY_CARE_PROVIDER_SITE_OTHER): Payer: Self-pay | Admitting: Obstetrics and Gynecology

## 2024-04-13 VITALS — BP 135/86 | HR 78 | Wt 205.0 lb

## 2024-04-13 DIAGNOSIS — N946 Dysmenorrhea, unspecified: Secondary | ICD-10-CM

## 2024-04-13 DIAGNOSIS — D219 Benign neoplasm of connective and other soft tissue, unspecified: Secondary | ICD-10-CM

## 2024-04-13 NOTE — Progress Notes (Signed)
 GYNECOLOGY VISIT  Patient name: Paula Bates MRN [id number]  Date of birth: 1975/12/16 Chief Complaint:   Fibroids   History:  Paula Bates is a 48 y.o. [phone] being seen today for discussion of fibroid management and surgery.  Needs to have another cystoscopic resection. Goes to [facility] for the procedure. Had a bladder ultraound that showed a small area of concern.  BP and DM have improved Has been on blood thinner previously for her prior bladder surgery and was on pills  She is certain she would like to proceed with surgery  Start a new job on May 9 - needs to get through 12 weeks of training, not sure on the PTO siutation.   Past Medical History:  Diagnosis Date   Bladder cancer (HCC) 12/2016   Diabetes mellitus without complication (HCC)    gestational diabetes   DM (diabetes mellitus), type 2 (HCC)    Gestational diabetes 10/12/2017   Early diagnosis--GDM x 2 pregnancies  Current Diabetic Medications:  Insulin   [ ]  Aspirin 81 mg daily after 12 weeks; discontinue after 36 weeks (? A2/B GDM)  Required Referrals for A1GDM or A2GDM: [ ]  Diabetes Education and Testing Supplies [ ]  Nutrition Cousult  For A2/B GDM or higher classes of DM [ ]  Diabetes Education and Testing Supplies [ ]  Nutrition Counsult [ ]  Fetal ECHO after [redacted] weeks     Gestational hypertension 12/10/2017   Herpes simplex infection    has never had outbreak, was tested in blood by family physician   History of pre-eclampsia 10/12/2017   Has not been on baby ASA   History of preterm delivery 10/12/2017   Painless cervical dilation with 1st delivery Not on any meds with second No meds/cerclage this pregnancy, patient was offered and she declined   Hx of tonsillectomy    48 years old   Hyperlipidemia    Infection    UTI   Pregnancy induced hypertension    Sickle cell trait (HCC) 10/13/2017   Vaginal Pap smear, abnormal    cryotherapy, ok since    Past Surgical History:  Procedure Laterality Date    BLADDER SURGERY  01/2016   CESAREAN SECTION N/A 12/13/2017   Procedure: CESAREAN SECTION;  Surgeon: Wendelyn Halter, MD;  Location: [facility] BIRTHING SUITES;  Service: Obstetrics;  Laterality: N/A;   TONSILLECTOMY  1996    The following portions of the patient's history were reviewed and updated as appropriate: allergies, current medications, past family history, past medical history, past social history, past surgical history and problem list.   Health Maintenance:   Last pap     Component Value Date/Time   DIAGPAP  05/19/2022 1640    - Negative for intraepithelial lesion or malignancy (NILM)   HPVHIGH Negative 05/19/2022 1640   ADEQPAP  05/19/2022 1640    Satisfactory for evaluation; transformation zone component PRESENT.    Last mammogram: n/a   Review of Systems:  Pertinent items are noted in HPI. Comprehensive review of systems was otherwise negative.   Objective:  Physical Exam BP 135/86   Pulse 78   Wt 205 lb (93 kg)   LMP 03/14/2024 (Within Days)   BMI 37.49 kg/m    Physical Exam Vitals and nursing note reviewed.  Constitutional:      Appearance: Normal appearance.  HENT:     Head: Normocephalic and atraumatic.  Pulmonary:     Effort: Pulmonary effort is normal.  Skin:    General: Skin is warm and dry.  Neurological:     General: No focal deficit present.     Mental Status: She is alert.  Psychiatric:        Mood and Affect: Mood normal.        Behavior: Behavior normal.        Thought Content: Thought content normal.        Judgment: Judgment normal.         Assessment & Plan:   1. Dysmenorrhea (Primary) 2. Fibroid Continue with plan for hysterectomy. Will need to be completed after cystoscopic resection. Plan for 10 days of anticoagulation given malignancy history. All questions answered. Hysterectomy consent previously signed. Benign EMB on file. A1c 6.2, Last H/H 11.5/33.9    Routine preventative health maintenance measures  emphasized.  Kiki Pelton, MD Minimally Invasive Gynecologic Surgery [facility] for [facility], [facility]

## 2024-05-15 ENCOUNTER — Emergency Department (HOSPITAL_COMMUNITY)
Admission: EM | Admit: 2024-05-15 | Discharge: 2024-05-15 | Discharge status: Against Medical Advice | Attending: Emergency Medicine | Admitting: Emergency Medicine

## 2024-05-15 ENCOUNTER — Encounter (HOSPITAL_COMMUNITY): Payer: Self-pay

## 2024-05-15 ENCOUNTER — Emergency Department (HOSPITAL_COMMUNITY)

## 2024-05-15 ENCOUNTER — Other Ambulatory Visit: Payer: Self-pay

## 2024-05-15 DIAGNOSIS — M25511 Pain in right shoulder: Secondary | ICD-10-CM | POA: Insufficient documentation

## 2024-05-15 DIAGNOSIS — Z5321 Procedure and treatment not carried out due to patient leaving prior to being seen by health care provider: Secondary | ICD-10-CM | POA: Insufficient documentation

## 2024-05-15 DIAGNOSIS — M542 Cervicalgia: Secondary | ICD-10-CM | POA: Diagnosis not present

## 2024-05-15 LAB — CBC WITH DIFFERENTIAL/PLATELET
Abs Immature Granulocytes: 0.03 10*3/uL (ref 0.00–0.07)
Basophils Absolute: 0 10*3/uL (ref 0.0–0.1)
Basophils Relative: 0 %
Eosinophils Absolute: 0.2 10*3/uL (ref 0.0–0.5)
Eosinophils Relative: 3 %
HCT: 35.7 % — ABNORMAL LOW (ref 36.0–46.0)
Hemoglobin: 11.8 g/dL — ABNORMAL LOW (ref 12.0–15.0)
Immature Granulocytes: 0 %
Lymphocytes Relative: 30 %
Lymphs Abs: 2.2 10*3/uL (ref 0.7–4.0)
MCH: 26.1 pg (ref 26.0–34.0)
MCHC: 33.1 g/dL (ref 30.0–36.0)
MCV: 79 fL — ABNORMAL LOW (ref 80.0–100.0)
Monocytes Absolute: 0.4 10*3/uL (ref 0.1–1.0)
Monocytes Relative: 5 %
Neutro Abs: 4.4 10*3/uL (ref 1.7–7.7)
Neutrophils Relative %: 62 %
Platelets: 240 10*3/uL (ref 150–400)
RBC: 4.52 MIL/uL (ref 3.87–5.11)
RDW: 15.4 % (ref 11.5–15.5)
WBC: 7.3 10*3/uL (ref 4.0–10.5)
nRBC: 0 % (ref 0.0–0.2)

## 2024-05-15 LAB — COMPREHENSIVE METABOLIC PANEL WITH GFR
ALT: 12 U/L (ref 0–44)
AST: 14 U/L — ABNORMAL LOW (ref 15–41)
Albumin: 3.6 g/dL (ref 3.5–5.0)
Alkaline Phosphatase: 64 U/L (ref 38–126)
Anion gap: 11 (ref 5–15)
BUN: 11 mg/dL (ref 6–20)
CO2: 23 mmol/L (ref 22–32)
Calcium: 9.2 mg/dL (ref 8.9–10.3)
Chloride: 102 mmol/L (ref 98–111)
Creatinine, Ser: 1.04 mg/dL — ABNORMAL HIGH (ref 0.44–1.00)
GFR, Estimated: >60 mL/min (ref >60)
Glucose, Bld: 145 mg/dL — ABNORMAL HIGH (ref 70–99)
Potassium: 3.7 mmol/L (ref 3.5–5.1)
Sodium: 136 mmol/L (ref 135–145)
Total Bilirubin: 0.3 mg/dL (ref 0.0–1.2)
Total Protein: 6.9 g/dL (ref 6.5–8.1)

## 2024-05-15 LAB — TROPONIN I (HIGH SENSITIVITY): Troponin I (High Sensitivity): <2 ng/L (ref <18)
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
## 2024-05-15 NOTE — ED Triage Notes (Signed)
 Pt reports 2 weeks of R shoulder and neck pain radiating down arm to elbow. Pt reports little relief with otc meds. No injury reported.

## 2024-05-15 NOTE — ED Notes (Signed)
 Called pt x3 for blood draw, no answer.  KM

## 2024-05-15 NOTE — ED Triage Notes (Addendum)
 Pt refuses to allow PA to examine her during triage to complete MSE. Pt attempted to take a picture and video of PA examining patient. PA explained that she was attempting to rule out any masses/abscesses in shoulder. Pt states, "I don't want you to touch me anymore. I told you it hurts. You also called me 'girl'. I'm a 48 year old woman. You better be careful with that." Attempted to redirect patient and explain exam. Pt continues to be hostile towards PA with this RN present.

## 2024-06-07 NOTE — Anesthesia Preprocedure Evaluation (Addendum)
  Relevant Problems  Anesthesia  (+) Hypertension associated with diabetes (*)  (+) Type 2 diabetes mellitus with hyperglycemia (*)  (+) Type 2 diabetes mellitus without complication, without long-term current use of insulin  (*)    Cardiovascular  (+) Hyperlipidemia associated with type 2 diabetes mellitus (*)  (+) Hypertension associated with diabetes (*)    Dermatology  (+) Atopic dermatitis in adult  (+) Flexural eczema    Endocrine  (+) Hyperlipidemia associated with type 2 diabetes mellitus (*)  (+) Hyperprolactinemia (*)  (+) Type 2 diabetes mellitus with hyperglycemia (*)  (+) Type 2 diabetes mellitus without complication, without long-term current use of insulin  (*)    Neuro/Psych  (+) Hyperprolactinemia (*)    Anesthesia ROS/Med History   Patient summary reviewed Anesthesia History (-) Anesthesia Complications Neuro/Psych (-) Seizures (-) CVA (-) TIA Pulmonary  (+) Smoking History (quit 2011) ; Former Smoker (+) Shortness of Breath  (-) Asthma (-) COPD (-) Obstructive Sleep Apnea (OSA) Cardiovascular  (+) Hypertension (HTN), status: poorly controlled (-) Coronary Artery Disease (CAD) GI/Hepatic  (-) Gastroesophageal Reflux Disease (GERD) (-) Liver Disease Endocrine/Cancer/Other  Comments: Sickle cell trait Hyperprolactinemia:  (+) Diabetes Mellitus (not on medication) type: Type 2 (+) Obesity Status: Obese - BMI 30-40 (-) Hypothyroidism (-) Hyperthyroidism        Physical Exam  Airway Mallampati: III TM distance: >3 FB Neck ROM: full     Cardiovascular - normal exam Rhythm: regular Rate: normal   Dental     Pulmonary  Breath sounds clear to auscultation   Abdominal  (+) obese   Neuro/Psych          Anesthesia Plan Anesthetic plan and risks discussed with patient. ASA 3   general   Plan Factors: The patient is not a current smoker.      PAT Visit needed?   Plan discussed with CRNA.  intravenous induction  Anesthesia Special needs:  LMA

## 2024-07-28 ENCOUNTER — Telehealth: Payer: Self-pay

## 2024-07-28 NOTE — Telephone Encounter (Signed)
 I called patient to see if she's available for surgery w/ Dr. Jeralyn on 09/29/24? I was unable to leave message due to patient mailbox being full.

## 2024-09-18 ENCOUNTER — Emergency Department (HOSPITAL_COMMUNITY)

## 2024-09-18 ENCOUNTER — Emergency Department (HOSPITAL_COMMUNITY): Admission: EM | Admit: 2024-09-18 | Discharge: 2024-09-18 | Disposition: A | Discharge status: Home / Self Care

## 2024-09-18 ENCOUNTER — Encounter (HOSPITAL_COMMUNITY): Payer: Self-pay

## 2024-09-18 ENCOUNTER — Other Ambulatory Visit: Payer: Self-pay

## 2024-09-18 DIAGNOSIS — E119 Type 2 diabetes mellitus without complications: Secondary | ICD-10-CM | POA: Diagnosis not present

## 2024-09-18 DIAGNOSIS — Z7984 Long term (current) use of oral hypoglycemic drugs: Secondary | ICD-10-CM | POA: Insufficient documentation

## 2024-09-18 DIAGNOSIS — R300 Dysuria: Secondary | ICD-10-CM | POA: Insufficient documentation

## 2024-09-18 DIAGNOSIS — R1024 Suprapubic pain: Secondary | ICD-10-CM | POA: Insufficient documentation

## 2024-09-18 DIAGNOSIS — Z7982 Long term (current) use of aspirin: Secondary | ICD-10-CM | POA: Insufficient documentation

## 2024-09-18 DIAGNOSIS — R109 Unspecified abdominal pain: Secondary | ICD-10-CM | POA: Diagnosis present

## 2024-09-18 DIAGNOSIS — Z79899 Other long term (current) drug therapy: Secondary | ICD-10-CM | POA: Diagnosis not present

## 2024-09-18 LAB — CBC WITH DIFFERENTIAL/PLATELET
Abs Immature Granulocytes: 0.04 K/uL (ref 0.00–0.07)
Basophils Absolute: 0 K/uL (ref 0.0–0.1)
Basophils Relative: 0 %
Eosinophils Absolute: 0 K/uL (ref 0.0–0.5)
Eosinophils Relative: 0 %
HCT: 37.7 % (ref 36.0–46.0)
Hemoglobin: 12.4 g/dL (ref 12.0–15.0)
Immature Granulocytes: 1 %
Lymphocytes Relative: 8 %
Lymphs Abs: 0.7 K/uL (ref 0.7–4.0)
MCH: 25.4 pg — ABNORMAL LOW (ref 26.0–34.0)
MCHC: 32.9 g/dL (ref 30.0–36.0)
MCV: 77.1 fL — ABNORMAL LOW (ref 80.0–100.0)
Monocytes Absolute: 0.2 K/uL (ref 0.1–1.0)
Monocytes Relative: 3 %
Neutro Abs: 7.9 K/uL — ABNORMAL HIGH (ref 1.7–7.7)
Neutrophils Relative %: 88 %
Platelets: 321 K/uL (ref 150–400)
RBC: 4.89 MIL/uL (ref 3.87–5.11)
RDW: 15.1 % (ref 11.5–15.5)
WBC: 8.8 K/uL (ref 4.0–10.5)
nRBC: 0 % (ref 0.0–0.2)

## 2024-09-18 LAB — I-STAT CHEM 8, ED
BUN: 7 mg/dL (ref 6–20)
Calcium, Ion: 1.19 mmol/L (ref 1.15–1.40)
Chloride: 103 mmol/L (ref 98–111)
Creatinine, Ser: 0.8 mg/dL (ref 0.44–1.00)
Glucose, Bld: 165 mg/dL — ABNORMAL HIGH (ref 70–99)
HCT: 40 % (ref 36.0–46.0)
Hemoglobin: 13.6 g/dL (ref 12.0–15.0)
Potassium: 3.8 mmol/L (ref 3.5–5.1)
Sodium: 141 mmol/L (ref 135–145)
TCO2: 23 mmol/L (ref 22–32)

## 2024-09-18 LAB — COMPREHENSIVE METABOLIC PANEL WITH GFR
ALT: 21 U/L (ref 0–44)
AST: 16 U/L (ref 15–41)
Albumin: 4 g/dL (ref 3.5–5.0)
Alkaline Phosphatase: 96 U/L (ref 38–126)
Anion gap: 11 (ref 5–15)
BUN: 8 mg/dL (ref 6–20)
CO2: 22 mmol/L (ref 22–32)
Calcium: 9 mg/dL (ref 8.9–10.3)
Chloride: 102 mmol/L (ref 98–111)
Creatinine, Ser: 0.73 mg/dL (ref 0.44–1.00)
GFR, Estimated: >60 mL/min (ref >60)
Glucose, Bld: 168 mg/dL — ABNORMAL HIGH (ref 70–99)
Potassium: 3.8 mmol/L (ref 3.5–5.1)
Sodium: 135 mmol/L (ref 135–145)
Total Bilirubin: 0.5 mg/dL (ref 0.0–1.2)
Total Protein: 7.6 g/dL (ref 6.5–8.1)

## 2024-09-18 LAB — URINALYSIS, ROUTINE W REFLEX MICROSCOPIC
Bilirubin Urine: NEGATIVE
Glucose, UA: NEGATIVE mg/dL
Hgb urine dipstick: NEGATIVE
Ketones, ur: NEGATIVE mg/dL
Leukocytes,Ua: NEGATIVE
Nitrite: NEGATIVE
Protein, ur: NEGATIVE mg/dL
Specific Gravity, Urine: 1.032 — ABNORMAL HIGH (ref 1.005–1.030)
pH: 5 (ref 5.0–8.0)

## 2024-09-18 LAB — RAPID URINE DRUG SCREEN, HOSP PERFORMED
Amphetamines: NOT DETECTED
Barbiturates: NOT DETECTED
Benzodiazepines: NOT DETECTED
Cocaine: NOT DETECTED
Opiates: NOT DETECTED
Tetrahydrocannabinol: POSITIVE — AB

## 2024-09-18 LAB — HCG, SERUM, QUALITATIVE: Preg, Serum: NEGATIVE

## 2024-09-18 MED ORDER — SODIUM CHLORIDE 0.9 % IV SOLN: INTRAVENOUS | Status: DC

## 2024-09-18 MED ORDER — ONDANSETRON HCL 4 MG/2ML IJ SOLN
4.0000 mg | Freq: Once | INTRAMUSCULAR | Status: AC
  Administered 2024-09-18: 4 mg via INTRAVENOUS
  Filled 2024-09-18: qty 2

## 2024-09-18 MED ORDER — LORAZEPAM 2 MG/ML IJ SOLN
1.0000 mg | Freq: Once | INTRAMUSCULAR | Status: AC
  Administered 2024-09-18: 1 mg via INTRAVENOUS
  Filled 2024-09-18: qty 1

## 2024-09-18 MED ORDER — FENTANYL CITRATE (PF) 50 MCG/ML IJ SOSY
50.0000 ug | PREFILLED_SYRINGE | Freq: Once | INTRAMUSCULAR | Status: AC
  Administered 2024-09-18: 50 ug via INTRAVENOUS
  Filled 2024-09-18: qty 1

## 2024-09-18 MED ORDER — SODIUM CHLORIDE 0.9 % IV BOLUS
1000.0000 mL | Freq: Once | INTRAVENOUS | Status: AC
  Administered 2024-09-18: 1000 mL via INTRAVENOUS

## 2024-09-18 MED ORDER — ALUM & MAG HYDROXIDE-SIMETH 200-200-20 MG/5ML PO SUSP
30.0000 mL | Freq: Once | ORAL | Status: AC
  Administered 2024-09-18: 30 mL via ORAL
  Filled 2024-09-18: qty 30

## 2024-09-18 MED ORDER — ACETAMINOPHEN 500 MG PO TABS
1000.0000 mg | ORAL_TABLET | Freq: Once | ORAL | Status: DC
Start: 2024-09-18 — End: 2024-09-18

## 2024-09-18 MED ORDER — IOHEXOL 350 MG/ML SOLN
75.0000 mL | Freq: Once | INTRAVENOUS | Status: AC | PRN
  Administered 2024-09-18: 75 mL via INTRAVENOUS

## 2024-09-18 MED ORDER — KETOROLAC TROMETHAMINE 15 MG/ML IJ SOLN: 15.0000 mg | Freq: Once | INTRAMUSCULAR | Status: DC

## 2024-09-18 NOTE — ED Provider Notes (Signed)
 [place] EMERGENCY DEPARTMENT AT [facility] Provider Note   CSN: [id number] Arrival date & time: 09/18/24  1115     Patient presents with: No chief complaint on file.   Paula Bates is a 48 y.o. female.   HPI     Prior to Admission medications   Medication Sig Start Date End Date Taking? Authorizing Provider  aspirin 325 MG tablet Take 325 mg by mouth daily. Patient not taking: Reported on 04/13/2024    [provider]  atorvastatin (LIPITOR) 10 MG tablet Take 10 mg by mouth daily.    [provider]  cabergoline  (DOSTINEX ) 0.5 MG tablet Take 0.25 mg by mouth 2 (two) times a week.    [provider]  ferrous sulfate  (FERROUSUL) 325 (65 FE) MG tablet Take 1 tablet (325 mg total) by mouth every other day. 05/19/22   Izell Harari, MD  lisinopril (ZESTRIL) 10 MG tablet Take by mouth. Patient not taking: Reported on 04/15/2022    [provider]  losartan (COZAAR) 100 MG tablet Take 100 mg by mouth daily. 04/10/22   [provider]  meloxicam  (MOBIC ) 7.5 MG tablet Take 1 tablet (7.5 mg total) by mouth daily. Patient not taking: Reported on 12/25/2021 08/28/19   Burky, Natalie B, NP  metFORMIN  (GLUCOPHAGE ) 1000 MG tablet Take 1,000 mg by mouth 2 (two) times daily.    [provider]  metFORMIN  (GLUCOPHAGE ) 500 MG tablet Take 1 tablet (500 mg total) by mouth 2 (two) times daily with a meal. 12/15/17 05/19/22  Lawayne Kennyth ORN, MD  methocarbamol  (ROBAXIN ) 750 MG tablet Take 1 tablet (750 mg total) by mouth 3 (three) times daily as needed (muscle spasm/pain). 02/26/24   Bernard Drivers, MD  ondansetron  (ZOFRAN ) 4 MG tablet Take 1 tablet (4 mg total) by mouth every 8 (eight) hours as needed for nausea or vomiting. Patient not taking: Reported on 04/13/2024 02/13/20   Darr, Jacob, PA-C  triamcinolone  cream (KENALOG ) 0.1 % Apply 1 application topically 2 (two) times daily. Patient not taking: Reported on 12/25/2021 09/13/20   Adah Corning  A, FNP  amLODipine  (NORVASC ) 5 MG tablet Take 1 tablet (5 mg total) by mouth daily. Patient not taking: Reported on 03/03/2018 12/15/17 09/13/20  Lawayne Kennyth ORN, MD    Allergies: Patient has no known allergies.    Review of Systems  Updated Vital Signs BP (!) 148/80 (BP Location: Left Arm)   Pulse (!) 51   Temp 98.3 F (36.8 C) (Oral)   Resp (!) 21   Ht 5' 2 (1.575 m)   Wt 81.6 kg   SpO2 100%   BMI 32.92 kg/m   Physical Exam  (all labs ordered are listed, but only abnormal results are displayed) Labs Reviewed  COMPREHENSIVE METABOLIC PANEL WITH GFR - Abnormal; Notable for the following components:      Result Value   Glucose, Bld 168 (*)    All other components within normal limits  CBC WITH DIFFERENTIAL/PLATELET - Abnormal; Notable for the following components:   MCV 77.1 (*)    MCH 25.4 (*)    Neutro Abs 7.9 (*)    All other components within normal limits  URINALYSIS, ROUTINE W REFLEX MICROSCOPIC - Abnormal; Notable for the following components:   Color, Urine STRAW (*)    Specific Gravity, Urine 1.032 (*)    All other components within normal limits  RAPID URINE DRUG SCREEN, HOSP PERFORMED - Abnormal; Notable for the following components:   Tetrahydrocannabinol POSITIVE (*)  All other components within normal limits  I-STAT CHEM 8, ED - Abnormal; Notable for the following components:   Glucose, Bld 165 (*)    All other components within normal limits  HCG, SERUM, QUALITATIVE  LIPASE, BLOOD    EKG: None  Radiology: CT ABDOMEN PELVIS W CONTRAST Result Date: 09/18/2024 CLINICAL DATA:  One-week history of dysuria and abdominal pain status post cystoscopy and resection of bladder tumor on 08/31/2024 EXAM: CT ABDOMEN AND PELVIS WITH CONTRAST TECHNIQUE: Multidetector CT imaging of the abdomen and pelvis was performed using the standard protocol following bolus administration of intravenous contrast. RADIATION DOSE REDUCTION: This exam was performed according to the  departmental dose-optimization program which includes automated exposure control, adjustment of the mA and/or kV according to patient size and/or use of iterative reconstruction technique. CONTRAST:  75mL OMNIPAQUE IOHEXOL 350 MG/ML SOLN COMPARISON:  CT abdomen and pelvis dated 12/21/2017, ultrasound pelvis dated 03/09/2024 FINDINGS: Lower chest: No focal consolidation or pulmonary nodule in the lung bases. No pleural effusion or pneumothorax demonstrated. Partially imaged heart size is normal. Hepatobiliary: 1.9 x 1.7 cm lobulated hypodensity along peripheral segment 8 (2:15), not substantially changed in size from 12/21/2017. No intra or extrahepatic biliary ductal dilation. Normal gallbladder. Pancreas: No focal lesions or main ductal dilation. Spleen: Normal in size without focal abnormality. Adrenals/Urinary Tract: No adrenal nodules. No suspicious renal mass, calculi or hydronephrosis. Small focus of mural thickening along the right lateral bladder wall (2:84). Stomach/Bowel: Normal appearance of the stomach. No evidence of bowel wall thickening, distention, or inflammatory changes. Normal appendix extends to the left mid abdomen (2:53). Vascular/Lymphatic: No significant vascular findings are present. No enlarged abdominal or pelvic lymph nodes. Reproductive: No adnexal masses. Anterior uterine fundal subserosal mass measures 12.2 x 10.9 cm, increased in size from 10.3 x 10.1 cm on 03/09/2024. Other: Trace pelvic free fluid.  No free air or fluid collection. Musculoskeletal: No acute or abnormal lytic or blastic osseous lesions. IMPRESSION: 1. Small focus of mural thickening along the right lateral bladder wall, which may be related to recent cystoscopy and resection of bladder tumor. 2. Increased size of anterior uterine fundal subserosal mass, likely leiomyoma. 3. A 1.9 x 1.7 cm lobulated hypodensity along peripheral segment 8 of the liver, not substantially changed in size from 12/21/2017, likely a cyst.  Given lobulated appearance, nonemergent liver protocol MRI abdomen can be considered for further evaluation of internal septations/complexity. Electronically Signed   By: Limin  Xu M.D.   On: 09/18/2024 14:00   DG Chest Portable 1 View Result Date: 09/18/2024 EXAM: 1 VIEW XRAY OF THE CHEST 09/18/2024 12:17:00 PM COMPARISON: Chest radiographs 05/15/2024 and earlier. CLINICAL HISTORY: 48 year old female. Rule out aspiration. Burning urination post bladder surgery, 10/10 abdominal pain, N/V/D with increased symptoms. FINDINGS: LUNGS AND PLEURA: No focal pulmonary opacity. No pulmonary edema. No pleural effusion. No pneumothorax. HEART AND MEDIASTINUM: No acute abnormality of the cardiac and mediastinal silhouettes. BONES AND SOFT TISSUES: No acute osseous abnormality. IMPRESSION: 1. Negative portable chest. Electronically signed by: Helayne Hurst MD 09/18/2024 12:28 PM EDT RP Workstation: [id number]     Procedures   Medications Ordered in the ED  sodium chloride  0.9 % bolus 1,000 mL (0 mLs Intravenous Stopped 09/18/24 1238)    And  0.9 %  sodium chloride  infusion ( Intravenous New Bag/Given 09/18/24 1348)  ondansetron  (ZOFRAN ) injection 4 mg (4 mg Intravenous Given 09/18/24 1144)  alum & mag hydroxide-simeth (MAALOX/MYLANTA) 200-200-20 MG/5ML suspension 30 mL (30 mLs Oral Given 09/18/24  1147)  fentaNYL  (SUBLIMAZE ) injection 50 mcg (50 mcg Intravenous Given 09/18/24 1227)  LORazepam (ATIVAN) injection 1 mg (1 mg Intravenous Given 09/18/24 1246)  iohexol (OMNIPAQUE) 350 MG/ML injection 75 mL (75 mLs Intravenous Contrast Given 09/18/24 1338)    Clinical Course as of 09/18/24 1555  Sun Sep 18, 2024  1519 S. Hx bladder malignancy s/p TURP. ~1 week of generalized abdominal 'burning'.  [WB]    Clinical Course User Index [WB] Riley Hallum, Elsie, MD                                 Medical Decision Making Amount and/or Complexity of Data Reviewed Labs: ordered. Radiology: ordered.  Risk OTC  drugs. Prescription drug management.   Patient initially seen by prior ED, see their note for full HPI.  In short, patient presenting to the ED with suprapubic/generalized abdominal pain following recent TURP procedure and cystoscopy approximately 1 week ago.  Endorsing mild dysuria without fevers, chills.  Upon assessment, patient asymptomatic with symptomatic controlled achieved approximately 3 hours prior.  Patient hemodynamically stable.  Hypertensive 148/80.  Afebrile.  No tachycardia.  Saturating 100% on RA with no evidence of respiratory distress.  Time of signout, CT abdomen already resulted without evidence of acute intra-abdominal abnormality.  Plan of care discharge pending UA, however UA without evidence of acute infection.  At this time, patient hemodynamically stable and appropriate for discharge. Final diagnoses:  Suprapubic pain    ED Discharge Orders     None          Angelamarie Avakian, Elsie, MD 09/18/24 1556    Emil Share, DO 09/18/24 1627

## 2024-09-18 NOTE — Discharge Instructions (Addendum)
 Your evaluated in the emergency department following a TURP procedure with a CT abdomen negative for evidence of acute abdominal abnormality.  Urinalysis was without evidence of infection.  Please follow-up with your primary care doctor as well as OB/[facility] as indicated.

## 2024-09-18 NOTE — ED Provider Notes (Signed)
 [place] EMERGENCY DEPARTMENT AT [facility] Provider Note   CSN: [id number] Arrival date & time: 09/18/24  1115     Patient presents with: Abdominal pain and dysuria   Paula Bates is a 48 y.o. female is brought to the ED via EMS with a primary concern of increased pain after having a cystoscopy 1 week ago.  No known previous history of bladder tumor, was removed by TURBT on 24 September.  Has had burning dysuria since this procedure.  Follow-up on 26 September did not show any concerning symptoms at that time, Foley was removed at that time.  Followed by [facility] urology for same.  States that over the last 12 hours she has had persistent nausea and vomiting along with diarrhea.  Pain is primarily located in the right upper quadrant, complains of a burning type sensation in the upper abdomen as well as reflux type symptoms.  Known previous medical history of type 2 diabetes, hyperlipidemia, morbid obesity.   HPI     Prior to Admission medications   Medication Sig Start Date End Date Taking? Authorizing Provider  aspirin 325 MG tablet Take 325 mg by mouth daily. Patient not taking: Reported on 04/13/2024    [provider]  atorvastatin (LIPITOR) 10 MG tablet Take 10 mg by mouth daily.    [provider]  cabergoline  (DOSTINEX ) 0.5 MG tablet Take 0.25 mg by mouth 2 (two) times a week.    [provider]  ferrous sulfate  (FERROUSUL) 325 (65 FE) MG tablet Take 1 tablet (325 mg total) by mouth every other day. 05/19/22   Izell Harari, MD  lisinopril (ZESTRIL) 10 MG tablet Take by mouth. Patient not taking: Reported on 04/15/2022    [provider]  losartan (COZAAR) 100 MG tablet Take 100 mg by mouth daily. 04/10/22   [provider]  meloxicam  (MOBIC ) 7.5 MG tablet Take 1 tablet (7.5 mg total) by mouth daily. Patient not taking: Reported on 12/25/2021 08/28/19   Burky, Natalie B, NP  metFORMIN  (GLUCOPHAGE ) 1000 MG tablet Take 1,000  mg by mouth 2 (two) times daily.    [provider]  metFORMIN  (GLUCOPHAGE ) 500 MG tablet Take 1 tablet (500 mg total) by mouth 2 (two) times daily with a meal. 12/15/17 05/19/22  Lawayne Kennyth ORN, MD  methocarbamol  (ROBAXIN ) 750 MG tablet Take 1 tablet (750 mg total) by mouth 3 (three) times daily as needed (muscle spasm/pain). 02/26/24   Steinl, Kevin, MD  ondansetron  (ZOFRAN ) 4 MG tablet Take 1 tablet (4 mg total) by mouth every 8 (eight) hours as needed for nausea or vomiting. Patient not taking: Reported on 04/13/2024 02/13/20   Darr, Jacob, PA-C  triamcinolone  cream (KENALOG ) 0.1 % Apply 1 application topically 2 (two) times daily. Patient not taking: Reported on 12/25/2021 09/13/20   Adah Corning A, FNP  amLODipine  (NORVASC ) 5 MG tablet Take 1 tablet (5 mg total) by mouth daily. Patient not taking: Reported on 03/03/2018 12/15/17 09/13/20  Lawayne Kennyth ORN, MD    Allergies: Patient has no known allergies.    Review of Systems  Constitutional:  Positive for fatigue.  Gastrointestinal:  Positive for abdominal pain, diarrhea, nausea and vomiting.  Neurological:  Positive for weakness.  All other systems reviewed and are negative.   Updated Vital Signs BP (!) 162/105   Pulse 60   Temp 98.5 F (36.9 C) (Oral)   Resp (!) 24   Ht 5' 2 (1.575 m)   Wt 81.6 kg  SpO2 100%   BMI 32.92 kg/m   Physical Exam Vitals and nursing note reviewed.  Constitutional:      General: She is awake. She is not in acute distress.    Appearance: Normal appearance. She is well-developed. She is obese.  HENT:     Head: Normocephalic and atraumatic.     Mouth/Throat:     Lips: Pink.     Mouth: Mucous membranes are moist.     Pharynx: Oropharynx is clear. Uvula midline. No pharyngeal swelling or posterior oropharyngeal erythema.  Eyes:     Extraocular Movements: Extraocular movements intact.     Conjunctiva/sclera: Conjunctivae normal.     Pupils: Pupils are equal, round, and reactive to light.   Cardiovascular:     Rate and Rhythm: Normal rate and regular rhythm.     Pulses: Normal pulses.     Heart sounds: Normal heart sounds. No murmur heard.    No friction rub. No gallop.  Pulmonary:     Effort: Pulmonary effort is normal.     Breath sounds: Normal breath sounds and air entry.  Abdominal:     General: Abdomen is flat. Bowel sounds are normal.     Palpations: Abdomen is soft.     Tenderness: There is generalized abdominal tenderness.     Comments: Generalized abdominal tenderness with greatest intensity at the right upper quadrant.  Musculoskeletal:        General: Normal range of motion.     Cervical back: Normal range of motion and neck supple.     Right lower leg: No edema.     Left lower leg: No edema.  Skin:    General: Skin is warm and dry.     Capillary Refill: Capillary refill takes less than 2 seconds.  Neurological:     General: No focal deficit present.     Mental Status: She is alert and oriented to person, place, and time. Mental status is at baseline.     GCS: GCS eye subscore is 4. GCS verbal subscore is 5. GCS motor subscore is 6.  Psychiatric:        Mood and Affect: Mood normal.        Behavior: Behavior is cooperative.     (all labs ordered are listed, but only abnormal results are displayed) Labs Reviewed  COMPREHENSIVE METABOLIC PANEL WITH GFR - Abnormal; Notable for the following components:      Result Value   Glucose, Bld 168 (*)    All other components within normal limits  CBC WITH DIFFERENTIAL/PLATELET - Abnormal; Notable for the following components:   MCV 77.1 (*)    MCH 25.4 (*)    Neutro Abs 7.9 (*)    All other components within normal limits  I-STAT CHEM 8, ED - Abnormal; Notable for the following components:   Glucose, Bld 165 (*)    All other components within normal limits  HCG, SERUM, QUALITATIVE  LIPASE, BLOOD  URINALYSIS, ROUTINE W REFLEX MICROSCOPIC  RAPID URINE DRUG SCREEN, HOSP PERFORMED     EKG: None  Radiology: CT ABDOMEN PELVIS W CONTRAST Result Date: 09/18/2024 CLINICAL DATA:  One-week history of dysuria and abdominal pain status post cystoscopy and resection of bladder tumor on 08/31/2024 EXAM: CT ABDOMEN AND PELVIS WITH CONTRAST TECHNIQUE: Multidetector CT imaging of the abdomen and pelvis was performed using the standard protocol following bolus administration of intravenous contrast. RADIATION DOSE REDUCTION: This exam was performed according to the departmental dose-optimization program which includes automated exposure  control, adjustment of the mA and/or kV according to patient size and/or use of iterative reconstruction technique. CONTRAST:  75mL OMNIPAQUE IOHEXOL 350 MG/ML SOLN COMPARISON:  CT abdomen and pelvis dated 12/21/2017, ultrasound pelvis dated 03/09/2024 FINDINGS: Lower chest: No focal consolidation or pulmonary nodule in the lung bases. No pleural effusion or pneumothorax demonstrated. Partially imaged heart size is normal. Hepatobiliary: 1.9 x 1.7 cm lobulated hypodensity along peripheral segment 8 (2:15), not substantially changed in size from 12/21/2017. No intra or extrahepatic biliary ductal dilation. Normal gallbladder. Pancreas: No focal lesions or main ductal dilation. Spleen: Normal in size without focal abnormality. Adrenals/Urinary Tract: No adrenal nodules. No suspicious renal mass, calculi or hydronephrosis. Small focus of mural thickening along the right lateral bladder wall (2:84). Stomach/Bowel: Normal appearance of the stomach. No evidence of bowel wall thickening, distention, or inflammatory changes. Normal appendix extends to the left mid abdomen (2:53). Vascular/Lymphatic: No significant vascular findings are present. No enlarged abdominal or pelvic lymph nodes. Reproductive: No adnexal masses. Anterior uterine fundal subserosal mass measures 12.2 x 10.9 cm, increased in size from 10.3 x 10.1 cm on 03/09/2024. Other: Trace pelvic free fluid.  No  free air or fluid collection. Musculoskeletal: No acute or abnormal lytic or blastic osseous lesions. IMPRESSION: 1. Small focus of mural thickening along the right lateral bladder wall, which may be related to recent cystoscopy and resection of bladder tumor. 2. Increased size of anterior uterine fundal subserosal mass, likely leiomyoma. 3. A 1.9 x 1.7 cm lobulated hypodensity along peripheral segment 8 of the liver, not substantially changed in size from 12/21/2017, likely a cyst. Given lobulated appearance, nonemergent liver protocol MRI abdomen can be considered for further evaluation of internal septations/complexity. Electronically Signed   By: Limin  Xu M.D.   On: 09/18/2024 14:00   DG Chest Portable 1 View Result Date: 09/18/2024 EXAM: 1 VIEW XRAY OF THE CHEST 09/18/2024 12:17:00 PM COMPARISON: Chest radiographs 05/15/2024 and earlier. CLINICAL HISTORY: 48 year old female. Rule out aspiration. Burning urination post bladder surgery, 10/10 abdominal pain, N/V/D with increased symptoms. FINDINGS: LUNGS AND PLEURA: No focal pulmonary opacity. No pulmonary edema. No pleural effusion. No pneumothorax. HEART AND MEDIASTINUM: No acute abnormality of the cardiac and mediastinal silhouettes. BONES AND SOFT TISSUES: No acute osseous abnormality. IMPRESSION: 1. Negative portable chest. Electronically signed by: Helayne Hurst MD 09/18/2024 12:28 PM EDT RP Workstation: [id number]     Procedures   Medications Ordered in the ED  sodium chloride  0.9 % bolus 1,000 mL (0 mLs Intravenous Stopped 09/18/24 1238)    And  0.9 %  sodium chloride  infusion ( Intravenous New Bag/Given 09/18/24 1348)  ondansetron  (ZOFRAN ) injection 4 mg (4 mg Intravenous Given 09/18/24 1144)  alum & mag hydroxide-simeth (MAALOX/MYLANTA) 200-200-20 MG/5ML suspension 30 mL (30 mLs Oral Given 09/18/24 1147)  fentaNYL  (SUBLIMAZE ) injection 50 mcg (50 mcg Intravenous Given 09/18/24 1227)  LORazepam (ATIVAN) injection 1 mg (1 mg Intravenous  Given 09/18/24 1246)  iohexol (OMNIPAQUE) 350 MG/ML injection 75 mL (75 mLs Intravenous Contrast Given 09/18/24 1338)    Clinical Course as of 09/18/24 1530  Sun Sep 18, 2024  1519 S. Hx bladder malignancy s/p TURP. ~1 week of generalized abdominal 'burning'.  [WB]    Clinical Course User Index [WB] Beam, Elsie, MD                                 Medical Decision Making Amount and/or Complexity of  Data Reviewed Labs: ordered. Radiology: ordered.  Risk OTC drugs. Prescription drug management.   Medical Decision Making:   Sherrise Hildebrant is a 48 y.o. female who presented to the ED today with abdominal pain and dysuria detailed above.    Handoff received from EMS.  External chart has been reviewed including labs, imaging, previous visits. Patient placed on continuous vitals and telemetry monitoring while in ED which was reviewed periodically.  Complete initial physical exam performed, notably the patient  was alert and oriented in no apparent distress, though visibly uncomfortable.  Very anxious appearing..    Reviewed and confirmed nursing documentation for past medical history, family history, social history.    Initial Assessment:   With the patient's presentation of abdominal pain, consider possible gastroenteritis, bowel obstruction, mesenteric ischemia, postprocedural pain, hyperemesis syndrome/cyclic vomiting.   Initial Plan:  CT abdomen pelvis secondary to diffuse abdominal tenderness and pain, postoperative pain Screening labs including CBC and Metabolic panel to evaluate for infectious or metabolic etiology of disease.  Include serum lipase to evaluate for pancreatic etiology Urinalysis with reflex culture ordered to evaluate for UTI or relevant urologic/nephrologic pathology.  Include UDS secondary to intractable vomiting. CXR to evaluate for structural/infectious intrathoracic pathology.  Rule out aspiration due to persistent nausea/vomiting EKG to evaluate for  cardiac pathology Objective evaluation as below reviewed   Initial Study Results:   Laboratory  All laboratory results reviewed without evidence of clinically relevant pathology.   Exceptions include: MCV 77.1, neutrophilia 7.9, urinalysis and UDS are pending as is the lipase.  EKG EKG was reviewed independently. Rate, rhythm, axis, intervals all examined and without medically relevant abnormality. ST segments without concerns for elevations.    Radiology:  All images reviewed independently. Agree with radiology report at this time.   CT ABDOMEN PELVIS W CONTRAST Result Date: 09/18/2024 CLINICAL DATA:  One-week history of dysuria and abdominal pain status post cystoscopy and resection of bladder tumor on 08/31/2024 EXAM: CT ABDOMEN AND PELVIS WITH CONTRAST TECHNIQUE: Multidetector CT imaging of the abdomen and pelvis was performed using the standard protocol following bolus administration of intravenous contrast. RADIATION DOSE REDUCTION: This exam was performed according to the departmental dose-optimization program which includes automated exposure control, adjustment of the mA and/or kV according to patient size and/or use of iterative reconstruction technique. CONTRAST:  75mL OMNIPAQUE IOHEXOL 350 MG/ML SOLN COMPARISON:  CT abdomen and pelvis dated 12/21/2017, ultrasound pelvis dated 03/09/2024 FINDINGS: Lower chest: No focal consolidation or pulmonary nodule in the lung bases. No pleural effusion or pneumothorax demonstrated. Partially imaged heart size is normal. Hepatobiliary: 1.9 x 1.7 cm lobulated hypodensity along peripheral segment 8 (2:15), not substantially changed in size from 12/21/2017. No intra or extrahepatic biliary ductal dilation. Normal gallbladder. Pancreas: No focal lesions or main ductal dilation. Spleen: Normal in size without focal abnormality. Adrenals/Urinary Tract: No adrenal nodules. No suspicious renal mass, calculi or hydronephrosis. Small focus of mural thickening  along the right lateral bladder wall (2:84). Stomach/Bowel: Normal appearance of the stomach. No evidence of bowel wall thickening, distention, or inflammatory changes. Normal appendix extends to the left mid abdomen (2:53). Vascular/Lymphatic: No significant vascular findings are present. No enlarged abdominal or pelvic lymph nodes. Reproductive: No adnexal masses. Anterior uterine fundal subserosal mass measures 12.2 x 10.9 cm, increased in size from 10.3 x 10.1 cm on 03/09/2024. Other: Trace pelvic free fluid.  No free air or fluid collection. Musculoskeletal: No acute or abnormal lytic or blastic osseous lesions. IMPRESSION: 1. Small focus of mural  thickening along the right lateral bladder wall, which may be related to recent cystoscopy and resection of bladder tumor. 2. Increased size of anterior uterine fundal subserosal mass, likely leiomyoma. 3. A 1.9 x 1.7 cm lobulated hypodensity along peripheral segment 8 of the liver, not substantially changed in size from 12/21/2017, likely a cyst. Given lobulated appearance, nonemergent liver protocol MRI abdomen can be considered for further evaluation of internal septations/complexity. Electronically Signed   By: Limin  Xu M.D.   On: 09/18/2024 14:00   DG Chest Portable 1 View Result Date: 09/18/2024 EXAM: 1 VIEW XRAY OF THE CHEST 09/18/2024 12:17:00 PM COMPARISON: Chest radiographs 05/15/2024 and earlier. CLINICAL HISTORY: 48 year old female. Rule out aspiration. Burning urination post bladder surgery, 10/10 abdominal pain, N/V/D with increased symptoms. FINDINGS: LUNGS AND PLEURA: No focal pulmonary opacity. No pulmonary edema. No pleural effusion. No pneumothorax. HEART AND MEDIASTINUM: No acute abnormality of the cardiac and mediastinal silhouettes. BONES AND SOFT TISSUES: No acute osseous abnormality. IMPRESSION: 1. Negative portable chest. Electronically signed by: Helayne Hurst MD 09/18/2024 12:28 PM EDT RP Workstation: [id number]     Reassessment and  Plan:   Assumed care handoff lipase is still pending as is the UDS and the urinalysis.  Reassessment of the patient she has had substantially reduced pain after administration of lorazepam and fentanyl .  She is continuing to get IV hydration.  CT scan of the abdomen did show some thickening along the right lateral bladder wall which is consistent with her recent cystoscopy and TURBT.  The liver did show a cyst that has not substantially changed in size from 2019, nonemergent MRI was recommended by radiology.  Further there is a anterior uterine fundal subserosal mass likely leiomyoma, which is increased in size from previous interval.  At this time, disposition is pending remaining workup.  Anticipate that she will be stable for discharge with outpatient follow-up to OB/GYN for findings of the subserosal mass in the anterior uterine fundus, further have outpatient imaging through primary care for the findings on the liver.  Care signed off to MICAEL Cornet, MD       Final diagnoses:  None    ED Discharge Orders     None          Myriam Dorn BROCKS, [place] 09/18/24 1534    Neysa Caron PARAS, DO 09/18/24 1544

## 2024-09-18 NOTE — ED Triage Notes (Signed)
 PT BIB [facility] from home, complains of burning with urination since bladder surgery 1 week ago, also endorses 10/10 abd pain. Experiencing N/v/d with increase in symptoms past 12 hours. Aox4.   EMS VS: BP 130/80, HR 90, CBG 189

## 2024-09-19 LAB — LIPASE, BLOOD: Lipase: 33 U/L (ref 11–51)
